# Patient Record
Sex: Female | Born: 2000 | Hispanic: Yes | Smoking: Never smoker
Health system: Southern US, Community
[De-identification: ages and names within clinical notes are randomized; demographics above are authoritative.]

## PROBLEM LIST (undated history)

## (undated) DIAGNOSIS — Z789 Other specified health status: Secondary | ICD-10-CM

## (undated) HISTORY — PX: NO PAST SURGERIES: SHX2092

---
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
## 2021-10-25 ENCOUNTER — Ambulatory Visit: Payer: Medicaid Other | Admitting: Licensed Practical Nurse

## 2021-10-26 ENCOUNTER — Ambulatory Visit: Payer: Medicaid Other | Admitting: Obstetrics

## 2021-11-10 ENCOUNTER — Ambulatory Visit: Payer: Medicaid Other | Admitting: Advanced Practice Midwife

## 2021-11-11 ENCOUNTER — Ambulatory Visit: Payer: Medicaid Other | Admitting: Advanced Practice Midwife

## 2021-11-24 ENCOUNTER — Telehealth: Payer: Self-pay | Admitting: Family Medicine

## 2021-11-24 NOTE — Telephone Encounter (Signed)
Patient called triage line to make appt. Tried calling patient back voicemail not set up.

## 2021-12-22 ENCOUNTER — Encounter: Payer: Self-pay | Admitting: Obstetrics & Gynecology

## 2021-12-22 ENCOUNTER — Other Ambulatory Visit (HOSPITAL_COMMUNITY)
Admission: RE | Admit: 2021-12-22 | Discharge: 2021-12-22 | Disposition: A | Discharge status: Home / Self Care | Payer: Self-pay | Source: Ambulatory Visit | Attending: Obstetrics & Gynecology | Admitting: Obstetrics & Gynecology

## 2021-12-22 ENCOUNTER — Ambulatory Visit (INDEPENDENT_AMBULATORY_CARE_PROVIDER_SITE_OTHER): Payer: Medicaid Other | Admitting: Obstetrics & Gynecology

## 2021-12-22 VITALS — BP 120/80 | Ht 63.0 in | Wt 182.0 lb

## 2021-12-22 DIAGNOSIS — Z3201 Encounter for pregnancy test, result positive: Secondary | ICD-10-CM | POA: Diagnosis not present

## 2021-12-22 DIAGNOSIS — N898 Other specified noninflammatory disorders of vagina: Secondary | ICD-10-CM | POA: Insufficient documentation

## 2021-12-22 DIAGNOSIS — N760 Acute vaginitis: Secondary | ICD-10-CM | POA: Diagnosis not present

## 2021-12-22 DIAGNOSIS — N926 Irregular menstruation, unspecified: Secondary | ICD-10-CM | POA: Diagnosis not present

## 2021-12-22 LAB — POCT URINE PREGNANCY: Preg Test, Ur: POSITIVE — AB

## 2021-12-26 LAB — CERVICOVAGINAL ANCILLARY ONLY
Bacterial Vaginitis (gardnerella): POSITIVE — AB
Candida Glabrata: NEGATIVE
Candida Vaginitis: NEGATIVE
Chlamydia: NEGATIVE
Comment: NEGATIVE
Comment: NEGATIVE
Comment: NEGATIVE
Comment: NEGATIVE
Comment: NEGATIVE
Comment: NORMAL
Neisseria Gonorrhea: NEGATIVE
Trichomonas: NEGATIVE

## 2022-01-02 ENCOUNTER — Telehealth: Payer: Self-pay

## 2022-01-02 ENCOUNTER — Other Ambulatory Visit: Payer: Self-pay | Admitting: Obstetrics

## 2022-01-02 MED ORDER — METRONIDAZOLE 500 MG PO TABS: 500.0000 mg | ORAL_TABLET | Freq: Two times a day (BID) | ORAL | 0 refills | Status: AC

## 2022-01-02 NOTE — Telephone Encounter (Signed)
Patient contacted office today wanting to know if a prescription would be sent in to treat her for BV? Patient states that she is pregnant and was seen in office on 12/22/21 and Nuswab came back positive for BV, patient states that she was not notified of results and states that she wanted to know if she could take medication to treat?she reports vaginal odor and discharge. Patient also reported on phone that she has her upcoming new OB intake appt and states that she has concerns because she has had lower abdominal pain intermittent associated with nausea and vomiting. Patient states that she vomits 4-5x a day. Patient would like prescriptions calling into [facility] on [address]. [place]

## 2022-01-02 NOTE — Telephone Encounter (Signed)
Hi Nicholos Johns. Looks like there is no note by [facility] for the visit on 8/10. I went ahead an ordered the patient some Metronidazole. You mention that she ishaving nausea and vomiting and is likely pregnant- it may be hard for her to take the pills. I will order her some zofran as well. Mirna Mires, CNM  01/02/2022 10:23 PM

## 2022-01-02 NOTE — Progress Notes (Unsigned)
This patein has contacted the office after noting that she saw her test swab from an office visit on 8/10 indicated BV. She was not notified by our office. I have sent in a script for flagyl. This patient has now reported a +pregnancy test and has an appointment. I will notify her that she may need to wait on the medication as she is early in the pregnancy, and is struggling with Nausea and vomiting. Mirna Mires, CNM  01/02/2022 10:17 PM

## 2022-01-04 ENCOUNTER — Ambulatory Visit (INDEPENDENT_AMBULATORY_CARE_PROVIDER_SITE_OTHER): Payer: Medicaid Other

## 2022-01-04 VITALS — Wt 182.0 lb

## 2022-01-04 DIAGNOSIS — Z369 Encounter for antenatal screening, unspecified: Secondary | ICD-10-CM

## 2022-01-04 DIAGNOSIS — Z3480 Encounter for supervision of other normal pregnancy, unspecified trimester: Secondary | ICD-10-CM

## 2022-01-04 DIAGNOSIS — Z3A00 Weeks of gestation of pregnancy not specified: Secondary | ICD-10-CM

## 2022-01-04 DIAGNOSIS — Z3482 Encounter for supervision of other normal pregnancy, second trimester: Secondary | ICD-10-CM

## 2022-01-04 HISTORY — DX: Encounter for supervision of other normal pregnancy, unspecified trimester: Z34.80

## 2022-01-04 NOTE — Progress Notes (Signed)
New OB Intake  I connected with  Samantha Gordon on 01/04/22 at  1:15 PM EDT by telephone and verified that I am speaking with the correct person using two identifiers. Nurse is located at [facility] and pt is located at home.  I explained I am completing New OB Intake today. We discussed her EDD of 07/08/2022 that is based on LMP of 10/01/2021. Pt is G2/P1001. I reviewed her allergies, medications, Medical/Surgical/OB history, and appropriate screenings. Based on history, this is a/an pregnancy uncomplicated .   Patient Active Problem List   Diagnosis Date Noted   Vaginitis 12/22/2021   Irregular contractions 04/06/2016   Labor and delivery indication for care or intervention 04/06/2016    Concerns addressed today Desires u/s to be sure of dates.  Delivery Plans:  Plans to deliver at [facility].  Anatomy US Explained first [place] will be scheduled for dating and an anatomy scan will be done at 20 weeks.  Labs Discussed genetic screening with patient. Patient desires genetic testing to be drawn with new OB labs. Discussed possible labs to be drawn at new OB appointment.  COVID Vaccine Patient has not had COVID vaccine.   Social Determinants of Health Food Insecurity: expresses food insecurity. Information given on local food banks.       Transportation: Patient denies transportation needs. Childcare: Discussed no children allowed at ultrasound appointments.   First visit review I reviewed new OB appt with pt. I explained she will have ob bloodwork and pap smear/pelvic exam if indicated. Explained pt will be seen by Dr. Linzie Collin at first visit; encounter routed to appropriate provider.   Loran Senters, [facility] 01/04/2022  1:35 PM  Clinical Staff Provider  Office Location  [facility] Dating    Language  English Anatomy US    Flu Vaccine  offer Genetic Screen  NIPS:   TDaP vaccine   offer Hgb A1C or  GTT Early : Third trimester :    Covid declines   LAB RESULTS   Rhogam   Blood Type     Feeding Plan undecided Antibody    Contraception undecided Rubella    Circumcision no RPR     Pediatrician  [facility] HBsAg     Support Person Felipe HIV    Prenatal Classes no Varicella     GBS  (For PCN allergy, check sensitivities)   BTL Consent  Hep C   VBAC Consent  Pap      Hgb Electro      CF      SMA

## 2022-01-04 NOTE — Progress Notes (Signed)
Pt is scheduled here at [facility] for the dating U/S.  There was availability.

## 2022-01-04 NOTE — Addendum Note (Signed)
Addended by: Loran Senters D on: 01/04/2022 02:55 PM   Modules accepted: Orders

## 2022-01-04 NOTE — Telephone Encounter (Signed)
Contacted patient and notified. KW

## 2022-01-05 ENCOUNTER — Other Ambulatory Visit: Payer: Medicaid Other

## 2022-01-05 ENCOUNTER — Ambulatory Visit: Payer: Medicaid Other

## 2022-01-05 DIAGNOSIS — Z3482 Encounter for supervision of other normal pregnancy, second trimester: Secondary | ICD-10-CM

## 2022-01-05 DIAGNOSIS — Z369 Encounter for antenatal screening, unspecified: Secondary | ICD-10-CM

## 2022-01-06 ENCOUNTER — Other Ambulatory Visit: Payer: Self-pay | Admitting: Obstetrics & Gynecology

## 2022-01-06 ENCOUNTER — Ambulatory Visit
Admission: RE | Admit: 2022-01-06 | Discharge: 2022-01-06 | Disposition: A | Discharge status: Home / Self Care | Payer: Medicaid Other | Source: Ambulatory Visit | Attending: Obstetrics & Gynecology | Admitting: Obstetrics & Gynecology

## 2022-01-06 DIAGNOSIS — Z3A13 13 weeks gestation of pregnancy: Secondary | ICD-10-CM | POA: Diagnosis not present

## 2022-01-06 DIAGNOSIS — Z3687 Encounter for antenatal screening for uncertain dates: Secondary | ICD-10-CM | POA: Insufficient documentation

## 2022-01-06 DIAGNOSIS — Z3482 Encounter for supervision of other normal pregnancy, second trimester: Secondary | ICD-10-CM

## 2022-01-09 ENCOUNTER — Ambulatory Visit (INDEPENDENT_AMBULATORY_CARE_PROVIDER_SITE_OTHER): Payer: Medicaid Other | Admitting: Obstetrics & Gynecology

## 2022-01-09 ENCOUNTER — Encounter: Payer: Self-pay | Admitting: Obstetrics & Gynecology

## 2022-01-09 ENCOUNTER — Other Ambulatory Visit: Payer: Medicaid Other

## 2022-01-09 VITALS — BP 104/62 | Wt 177.6 lb

## 2022-01-09 DIAGNOSIS — Z3482 Encounter for supervision of other normal pregnancy, second trimester: Secondary | ICD-10-CM

## 2022-01-09 DIAGNOSIS — O43101 Malformation of placenta, unspecified, first trimester: Secondary | ICD-10-CM

## 2022-01-09 DIAGNOSIS — Z3A14 14 weeks gestation of pregnancy: Secondary | ICD-10-CM

## 2022-01-09 DIAGNOSIS — Z369 Encounter for antenatal screening, unspecified: Secondary | ICD-10-CM

## 2022-01-09 LAB — POCT URINALYSIS DIPSTICK OB
Bilirubin, UA: NEGATIVE
Blood, UA: NEGATIVE
Glucose, UA: NEGATIVE
Ketones, UA: NEGATIVE
Leukocytes, UA: NEGATIVE
Nitrite, UA: NEGATIVE
Spec Grav, UA: 1.025 (ref 1.010–1.025)
Urobilinogen, UA: 0.2 E.U./dL
pH, UA: 6 (ref 5.0–8.0)

## 2022-01-09 NOTE — Progress Notes (Signed)
  Subjective:    Samantha Gordon is a 21 y.o. female being seen today for her obstetrical visit. She is at [redacted]w[redacted]d gestation. Patient reports no complaints. Fetal movement: normal.  Menstrual History: OB History     Gravida  2   Para  1   Term  1   Preterm      AB      Living  1      SAB      IAB      Ectopic      Multiple  0   Live Births  1            Patient's last menstrual period was 10/01/2021 (exact date).    The following portions of the patient's history were reviewed and updated as appropriate: allergies, current medications, past family history, past medical history, past social history, past surgical history, and problem list.  Review of Systems A comprehensive review of systems was negative.   Objective:     BP 104/62   Wt 177 lb 9.6 oz (80.6 kg)   LMP 10/01/2021 (Exact Date)   BMI 31.46 kg/m  Under 12 weeks no FHT Uterine Size:   Pelvic Exam:          Perineum:                 Vulva:               Vagina:                       pH:               Cervix:           Wet Prep:               Uterus:              Adnexa:      Bony Pelvis:      Assessment:    Pregnancy  and 14 2/7 weeks   Plan:    Problem list reviewed and updated. Labs reviewed. AFP3 discussed:  discussed . Role of ultrasound in pregnancy discussed; fetal survey: ordered. Amniocentesis discussed: not indicated. Follow up in 4 weeks. Questioning placental lake, repeat US ordered for 2 weeks from now  Linzie Collin, MD  02/26/2022 9:20 PM \

## 2022-01-10 LAB — CBC/D/PLT+RPR+RH+ABO+RUBIGG...
Antibody Screen: NEGATIVE
Basophils Absolute: 0 10*3/uL (ref 0.0–0.2)
Basos: 0 %
EOS (ABSOLUTE): 0.1 10*3/uL (ref 0.0–0.4)
Eos: 1 %
HCV Ab: NONREACTIVE
HIV Screen 4th Generation wRfx: NONREACTIVE
Hematocrit: 39.2 % (ref 34.0–46.6)
Hemoglobin: 13.5 g/dL (ref 11.1–15.9)
Hepatitis B Surface Ag: NEGATIVE
Immature Grans (Abs): 0 10*3/uL (ref 0.0–0.1)
Immature Granulocytes: 0 %
Lymphocytes Absolute: 1.4 10*3/uL (ref 0.7–3.1)
Lymphs: 17 %
MCH: 31.2 pg (ref 26.6–33.0)
MCHC: 34.4 g/dL (ref 31.5–35.7)
MCV: 91 fL (ref 79–97)
Monocytes Absolute: 0.7 10*3/uL (ref 0.1–0.9)
Monocytes: 8 %
Neutrophils Absolute: 6.2 10*3/uL (ref 1.4–7.0)
Neutrophils: 74 %
Platelets: 317 10*3/uL (ref 150–450)
RBC: 4.33 x10E6/uL (ref 3.77–5.28)
RDW: 12.8 % (ref 11.7–15.4)
RPR Ser Ql: NONREACTIVE
Rh Factor: POSITIVE
Rubella Antibodies, IGG: 1.77 index (ref >0.99)
Varicella zoster IgG: 368 index (ref >165)
WBC: 8.4 10*3/uL (ref 3.4–10.8)

## 2022-01-10 LAB — HCV INTERPRETATION

## 2022-01-13 LAB — MATERNIT 21 PLUS CORE, BLOOD
Fetal Fraction: 5
Result (T21): NEGATIVE
Trisomy 13 (Patau syndrome): NEGATIVE
Trisomy 18 (Edwards syndrome): NEGATIVE
Trisomy 21 (Down syndrome): NEGATIVE

## 2022-01-23 ENCOUNTER — Ambulatory Visit: Admission: RE | Admit: 2022-01-23 | Payer: Medicaid Other | Source: Ambulatory Visit

## 2022-01-25 ENCOUNTER — Ambulatory Visit
Admission: RE | Admit: 2022-01-25 | Discharge: 2022-01-25 | Disposition: A | Discharge status: Home / Self Care | Payer: Medicaid Other | Source: Ambulatory Visit | Attending: Obstetrics & Gynecology | Admitting: Obstetrics & Gynecology

## 2022-01-25 DIAGNOSIS — O43101 Malformation of placenta, unspecified, first trimester: Secondary | ICD-10-CM | POA: Diagnosis present

## 2022-02-06 ENCOUNTER — Encounter: Payer: Medicaid Other | Admitting: Obstetrics & Gynecology

## 2022-02-06 ENCOUNTER — Telehealth: Payer: Self-pay | Admitting: Obstetrics & Gynecology

## 2022-02-06 NOTE — Telephone Encounter (Signed)
Reached out to pt to reschedule OB appt that was scheduled on 9/25 at 10:55 with Dr. Hulan Fray.  Could not leave a message, because mailbox was not set up.  Will send a MyChart message.

## 2022-02-11 ENCOUNTER — Encounter: Payer: Self-pay | Admitting: Obstetrics & Gynecology

## 2022-02-11 NOTE — Progress Notes (Signed)
Subjective:     Samantha Gordon is a 21 y.o. female who presents for evaluation of an abnormal vaginal discharge. Symptoms have been present for  Vaginal symptoms: discharge described as malodorous, odor, and missed period, acute vaginitis. . Contraception: none.  Menstrual flow: missed.  The following portions of the patient's history were reviewed and updated as appropriate: allergies, current medications, past family history, past medical history, past social history, past surgical history, and problem list.   Review of Systems A comprehensive review of systems was negative.    Objective:    BP 120/80   Ht 5\' 3"  (1.6 m)   Wt 182 lb (82.6 kg)   LMP 10/01/2021   BMI 32.24 kg/m   General Appearance:    Alert, cooperative, no distress, appears stated age  Head:    Normocephalic, without obvious abnormality, atraumatic  Eyes:    PERRL, conjunctiva/corneas clear, EOM's intact, fundi    benign, both eyes  Ears:    Normal TM's and external ear canals, both ears  Nose:   Nares normal, septum midline, mucosa normal, no drainage    or sinus tenderness  Throat:   Lips, mucosa, and tongue normal; teeth and gums normal  Neck:   Supple, symmetrical, trachea midline, no adenopathy;    thyroid:  no enlargement/tenderness/nodules; no carotid   bruit or JVD  Back:     Symmetric, no curvature, ROM normal, no CVA tenderness  Lungs:     Clear to auscultation bilaterally, respirations unlabored  Chest Wall:    No tenderness or deformity   Heart:    Regular rate and rhythm, S1 and S2 normal, no murmur, rub   or gallop  Breast Exam:    No tenderness, masses, or nipple abnormality  Abdomen:     Soft, non-tender, bowel sounds active all four quadrants,    no masses, no organomegaly  Genitalia:    Normal female without lesion, discharge or tenderness  Rectal:    Normal tone, normal prostate, no masses or tenderness;   guaiac negative stool  Extremities:   Extremities normal, atraumatic, no  cyanosis or edema  Pulses:   2+ and symmetric all extremities  Skin:   Skin color, texture, turgor normal, no rashes or lesions  Lymph nodes:   Cervical, supraclavicular, and axillary nodes normal  Neurologic:   CNII-XII intact, normal strength, sensation and reflexes    throughout      Assessment:    Bacterial vaginosis.    Plan:    Cervicovaginal ancillary, POCT urine preg. ordered  RTN as scheduled  Rosario Adie, MD  02/11/2022 2:02 PM

## 2022-02-20 ENCOUNTER — Telehealth: Payer: Self-pay

## 2022-02-20 NOTE — Telephone Encounter (Signed)
Patient called in stating that she has some urinary symptoms. She has an appointment tomorrow and wanted to make sure that she could be evaluated for new concerns at that appointment. I advised her that she should let the CMA know what her new symptoms are at intake and concerns would be addressed at the appointment tomorrow. She verbalized understanding.

## 2022-02-21 ENCOUNTER — Ambulatory Visit (INDEPENDENT_AMBULATORY_CARE_PROVIDER_SITE_OTHER): Payer: Medicaid Other | Admitting: Obstetrics

## 2022-02-21 ENCOUNTER — Other Ambulatory Visit (HOSPITAL_COMMUNITY)
Admission: RE | Admit: 2022-02-21 | Discharge: 2022-02-21 | Disposition: A | Discharge status: Home / Self Care | Payer: Medicaid Other | Source: Ambulatory Visit | Attending: Obstetrics & Gynecology | Admitting: Obstetrics & Gynecology

## 2022-02-21 VITALS — BP 124/73 | HR 97 | Wt 170.9 lb

## 2022-02-21 DIAGNOSIS — R309 Painful micturition, unspecified: Secondary | ICD-10-CM | POA: Insufficient documentation

## 2022-02-21 DIAGNOSIS — Z3A20 20 weeks gestation of pregnancy: Secondary | ICD-10-CM

## 2022-02-21 DIAGNOSIS — O99891 Other specified diseases and conditions complicating pregnancy: Secondary | ICD-10-CM

## 2022-02-21 DIAGNOSIS — Z3482 Encounter for supervision of other normal pregnancy, second trimester: Secondary | ICD-10-CM

## 2022-02-21 LAB — POCT URINALYSIS DIPSTICK OB
Bilirubin, UA: NEGATIVE
Blood, UA: NEGATIVE
Glucose, UA: NEGATIVE
Ketones, UA: NEGATIVE
Nitrite, UA: NEGATIVE
POC,PROTEIN,UA: NEGATIVE
Spec Grav, UA: 1.02 (ref 1.010–1.025)
Urobilinogen, UA: 0.2 E.U./dL
pH, UA: 7 (ref 5.0–8.0)

## 2022-02-21 NOTE — Progress Notes (Signed)
ROB at [redacted]w[redacted]d. Good fetal movement. Samantha Gordon denies ctx, vaginal bleeding, LOF. She has been having pain with urination and vaginal discharge for about 4-5 days. She denies hematuria but does endorse urinary frequency. She reports clumpy white discharge. Urine culture sent. Swab collected. [name] has had [place] to evaluate placental lake but not an anatomy scan. Will schedule in next 1-2 weeks. Anticipatory guidance about late 2nd trimester. ROB in 4 weeks.  Lloyd Huger, CNM

## 2022-02-23 LAB — CERVICOVAGINAL ANCILLARY ONLY
Bacterial Vaginitis (gardnerella): POSITIVE — AB
Candida Glabrata: NEGATIVE
Candida Vaginitis: POSITIVE — AB
Chlamydia: NEGATIVE
Comment: NEGATIVE
Comment: NEGATIVE
Comment: NEGATIVE
Comment: NEGATIVE
Comment: NEGATIVE
Comment: NORMAL
Neisseria Gonorrhea: NEGATIVE
Trichomonas: NEGATIVE

## 2022-02-23 LAB — URINE CULTURE

## 2022-02-24 ENCOUNTER — Telehealth: Payer: Self-pay

## 2022-02-24 DIAGNOSIS — B9689 Other specified bacterial agents as the cause of diseases classified elsewhere: Secondary | ICD-10-CM

## 2022-02-24 MED ORDER — METRONIDAZOLE 500 MG PO TABS: 500.0000 mg | ORAL_TABLET | Freq: Two times a day (BID) | ORAL | 0 refills | Status: DC

## 2022-02-24 NOTE — Telephone Encounter (Signed)
Pt called for Aptima results positive for BV and yeast. Flagyl sent in for BV advised pt to do Monistat for Yeast.

## 2022-02-26 ENCOUNTER — Encounter: Payer: Self-pay | Admitting: Obstetrics & Gynecology

## 2022-02-28 ENCOUNTER — Ambulatory Visit
Admission: RE | Admit: 2022-02-28 | Discharge: 2022-02-28 | Disposition: A | Discharge status: Home / Self Care | Payer: Medicaid Other | Source: Ambulatory Visit | Attending: Obstetrics | Admitting: Obstetrics

## 2022-02-28 DIAGNOSIS — Z3689 Encounter for other specified antenatal screening: Secondary | ICD-10-CM | POA: Diagnosis not present

## 2022-02-28 DIAGNOSIS — Z3A22 22 weeks gestation of pregnancy: Secondary | ICD-10-CM | POA: Diagnosis not present

## 2022-02-28 DIAGNOSIS — Z3482 Encounter for supervision of other normal pregnancy, second trimester: Secondary | ICD-10-CM | POA: Insufficient documentation

## 2022-03-10 ENCOUNTER — Encounter: Payer: Self-pay | Admitting: Obstetrics

## 2022-03-21 ENCOUNTER — Ambulatory Visit (INDEPENDENT_AMBULATORY_CARE_PROVIDER_SITE_OTHER): Payer: Medicaid Other | Admitting: Licensed Practical Nurse

## 2022-03-21 ENCOUNTER — Encounter: Payer: Self-pay | Admitting: Licensed Practical Nurse

## 2022-03-21 VITALS — BP 115/73 | HR 97 | Wt 171.0 lb

## 2022-03-21 DIAGNOSIS — Z131 Encounter for screening for diabetes mellitus: Secondary | ICD-10-CM

## 2022-03-21 DIAGNOSIS — Z3A24 24 weeks gestation of pregnancy: Secondary | ICD-10-CM

## 2022-03-21 DIAGNOSIS — B9689 Other specified bacterial agents as the cause of diseases classified elsewhere: Secondary | ICD-10-CM

## 2022-03-21 DIAGNOSIS — Z23 Encounter for immunization: Secondary | ICD-10-CM | POA: Diagnosis not present

## 2022-03-21 DIAGNOSIS — Z3482 Encounter for supervision of other normal pregnancy, second trimester: Secondary | ICD-10-CM

## 2022-03-21 LAB — POCT URINALYSIS DIPSTICK
Bilirubin, UA: NEGATIVE
Blood, UA: NEGATIVE
Glucose, UA: NEGATIVE
Ketones, UA: NEGATIVE
Leukocytes, UA: NEGATIVE
Nitrite, UA: NEGATIVE
Protein, UA: NEGATIVE
Spec Grav, UA: 1.015 (ref 1.010–1.025)
Urobilinogen, UA: 1 E.U./dL
pH, UA: 5.5 (ref 5.0–8.0)

## 2022-03-21 NOTE — Progress Notes (Signed)
Routine Prenatal Care Visit  Subjective  Samantha Gordon is a 21 y.o. G2P1001 at [redacted]w[redacted]d being seen today for ongoing prenatal care.  She is currently monitored for the following issues for this low-risk pregnancy and has Irregular contractions; Labor and delivery indication for care or intervention; Vaginitis; and Supervision of other normal pregnancy, antepartum on their problem list.  ----------------------------------------------------------------------------------- Patient reports  fatigue, gets 10 hours of sleep and naps .  Her urine is cloudy, wonders if she has a UTI, denies dysuria-reviewed most recent urine culture was negative. Urine dark today, encouraged pt to increase fluids. -Expressed concern for being treated for BV twice in this pregnancy, Currently denies any vaginal symptoms. Reviewed that if she develops symptoms again-we would do more extensive testing and may need to change treatment plan.  -considering breastfeeding this time, Contractions: Not present. Vag. Bleeding: None.  Movement: Present. Leaking Fluid denies.  ----------------------------------------------------------------------------------- The following portions of the patient's history were reviewed and updated as appropriate: allergies, current medications, past family history, past medical history, past social history, past surgical history and problem list. Problem list updated.  Objective  Blood pressure 115/73, pulse 97, weight 171 lb (77.6 kg), last menstrual period 10/01/2021. Pregravid weight 185 lb (83.9 kg) Total Weight Gain -14 lb (-6.35 kg) Urinalysis: Urine Protein    Urine Glucose    Fetal Status: Fetal Heart Rate (bpm): 130 Fundal Height: 25 cm Movement: Present     General:  Alert, oriented and cooperative. Patient is in no acute distress.  Skin: Skin is warm and dry. No rash noted.   Cardiovascular: Normal heart rate noted  Respiratory: Normal respiratory effort, no problems with  respiration noted  Abdomen: Soft, gravid, appropriate for gestational age. Pain/Pressure: Absent     Pelvic:  Cervical exam deferred        Extremities: Normal range of motion.  Edema: None  Mental Status: Normal mood and affect. Normal behavior. Normal judgment and thought content.   Assessment   21 y.o. G2P1001 at [redacted]w[redacted]d by  07/08/2022, by Last Menstrual Period presenting for routine prenatal visit  Plan   second Problems (from 01/04/22 to present)     No problems associated with this episode.        Preterm labor symptoms and general obstetric precautions including but not limited to vaginal bleeding, contractions, leaking of fluid and fetal movement were reviewed in detail with the patient. Please refer to After Visit Summary for other counseling recommendations.   Return in about 4 weeks (around 04/18/2022) for ROB, 28 wk labs . Flu vaccine today   Roberto Scales, [place], [facility]  03/21/22  11:22 AM

## 2022-04-19 ENCOUNTER — Telehealth: Payer: Self-pay

## 2022-04-19 ENCOUNTER — Encounter: Payer: Self-pay | Admitting: Licensed Practical Nurse

## 2022-04-19 ENCOUNTER — Encounter: Payer: Medicaid Other | Admitting: Advanced Practice Midwife

## 2022-04-19 ENCOUNTER — Ambulatory Visit (INDEPENDENT_AMBULATORY_CARE_PROVIDER_SITE_OTHER): Payer: Medicaid Other | Admitting: Licensed Practical Nurse

## 2022-04-19 ENCOUNTER — Other Ambulatory Visit: Payer: Medicaid Other

## 2022-04-19 VITALS — BP 113/69 | HR 95 | Wt 172.8 lb

## 2022-04-19 DIAGNOSIS — Z3A29 29 weeks gestation of pregnancy: Secondary | ICD-10-CM

## 2022-04-19 DIAGNOSIS — Z3483 Encounter for supervision of other normal pregnancy, third trimester: Secondary | ICD-10-CM

## 2022-04-19 DIAGNOSIS — O2613 Low weight gain in pregnancy, third trimester: Secondary | ICD-10-CM

## 2022-04-19 DIAGNOSIS — Z3A28 28 weeks gestation of pregnancy: Secondary | ICD-10-CM

## 2022-04-19 DIAGNOSIS — Z3482 Encounter for supervision of other normal pregnancy, second trimester: Secondary | ICD-10-CM

## 2022-04-19 DIAGNOSIS — O26893 Other specified pregnancy related conditions, third trimester: Secondary | ICD-10-CM

## 2022-04-19 DIAGNOSIS — B9689 Other specified bacterial agents as the cause of diseases classified elsewhere: Secondary | ICD-10-CM

## 2022-04-19 DIAGNOSIS — O219 Vomiting of pregnancy, unspecified: Secondary | ICD-10-CM

## 2022-04-19 DIAGNOSIS — O26813 Pregnancy related exhaustion and fatigue, third trimester: Secondary | ICD-10-CM

## 2022-04-19 LAB — POCT URINALYSIS DIPSTICK
Bilirubin, UA: NEGATIVE
Blood, UA: NEGATIVE
Glucose, UA: NEGATIVE
Ketones, UA: NEGATIVE
Leukocytes, UA: NEGATIVE
Nitrite, UA: NEGATIVE
Protein, UA: NEGATIVE
Spec Grav, UA: 1.01 (ref 1.010–1.025)
Urobilinogen, UA: 1 E.U./dL
pH, UA: 6.5 (ref 5.0–8.0)

## 2022-04-19 MED ORDER — DOXYLAMINE-PYRIDOXINE 10-10 MG PO TBEC: 2.0000 | DELAYED_RELEASE_TABLET | Freq: Every day | ORAL | 5 refills | Status: DC

## 2022-04-19 NOTE — Telephone Encounter (Signed)
Pt calling; was seen today and told she was measuring small; is unable to schedule u/s appt d/t they, [facility] u/s, needs order faxed over and they haven't recv'd it yet. Please call pt when this is done.  [phone]

## 2022-04-20 LAB — 28 WEEK RH+PANEL
Basophils Absolute: 0 10*3/uL (ref 0.0–0.2)
Basos: 0 %
EOS (ABSOLUTE): 0.1 10*3/uL (ref 0.0–0.4)
Eos: 1 %
Gestational Diabetes Screen: 150 mg/dL — ABNORMAL HIGH (ref 70–139)
HIV Screen 4th Generation wRfx: NONREACTIVE
Hematocrit: 32.9 % — ABNORMAL LOW (ref 34.0–46.6)
Hemoglobin: 11.3 g/dL (ref 11.1–15.9)
Immature Grans (Abs): 0.1 10*3/uL (ref 0.0–0.1)
Immature Granulocytes: 1 %
Lymphocytes Absolute: 1.3 10*3/uL (ref 0.7–3.1)
Lymphs: 13 %
MCH: 31.5 pg (ref 26.6–33.0)
MCHC: 34.3 g/dL (ref 31.5–35.7)
MCV: 92 fL (ref 79–97)
Monocytes Absolute: 0.7 10*3/uL (ref 0.1–0.9)
Monocytes: 7 %
Neutrophils Absolute: 7.6 10*3/uL — ABNORMAL HIGH (ref 1.4–7.0)
Neutrophils: 78 %
Platelets: 262 10*3/uL (ref 150–450)
RBC: 3.59 x10E6/uL — ABNORMAL LOW (ref 3.77–5.28)
RDW: 11.5 % — ABNORMAL LOW (ref 11.7–15.4)
RPR Ser Ql: NONREACTIVE
WBC: 9.7 10*3/uL (ref 3.4–10.8)

## 2022-04-20 LAB — CBC
Hematocrit: 33 % — ABNORMAL LOW (ref 34.0–46.6)
Hemoglobin: 11 g/dL — ABNORMAL LOW (ref 11.1–15.9)
MCH: 31.1 pg (ref 26.6–33.0)
MCHC: 33.3 g/dL (ref 31.5–35.7)
MCV: 93 fL (ref 79–97)
Platelets: 242 10*3/uL (ref 150–450)
RBC: 3.54 x10E6/uL — ABNORMAL LOW (ref 3.77–5.28)
RDW: 12.1 % (ref 11.7–15.4)
WBC: 10 10*3/uL (ref 3.4–10.8)

## 2022-04-20 NOTE — Telephone Encounter (Signed)
Pt calling to get results of her glucose test. Pt aware it was elevated and she would probably get schedule for a 3 hour test, didn't know if you would order or was we supposed to .

## 2022-04-20 NOTE — Telephone Encounter (Signed)
I contacted patient via phone. I left voicemail for patient to call back to be scheduled.   

## 2022-04-20 NOTE — Telephone Encounter (Signed)
Sent to sara to schedule.

## 2022-04-22 LAB — NUSWAB VAGINITIS PLUS (VG+)
Atopobium vaginae: HIGH Score — AB
Candida albicans, NAA: NEGATIVE
Candida glabrata, NAA: NEGATIVE
Chlamydia trachomatis, NAA: NEGATIVE
Megasphaera 1: HIGH Score — AB
Neisseria gonorrhoeae, NAA: NEGATIVE
Trich vag by NAA: NEGATIVE

## 2022-04-23 MED ORDER — CLINDAMYCIN HCL 300 MG PO CAPS: 300.0000 mg | ORAL_CAPSULE | Freq: Two times a day (BID) | ORAL | 0 refills | Status: DC

## 2022-04-23 MED ORDER — ONDANSETRON HCL 4 MG PO TABS: 4.0000 mg | ORAL_TABLET | Freq: Three times a day (TID) | ORAL | 0 refills | Status: DC | PRN

## 2022-04-23 NOTE — Progress Notes (Signed)
04/23/2022 TC to [facility], You have BV.  I have ordered Clindamycin as this may help better.  Reviewed Boric Acid and vaginal probiotics with the caution that it is not clear if these are safe in pregnancy, but something to be considered. Reviewed sometimes BV is chronic and needs a long course of treatment to be cleared.   Pt reports her insurance does not to cover Diclegis desires  script for Zofran-script sent   Samantha Gordon   [facility]  04/23/22  5:33 PM

## 2022-04-23 NOTE — Progress Notes (Signed)
Routine Prenatal Care Visit  Subjective  Samantha Gordon is a 21 y.o. G2P1001 at [redacted]w[redacted]d being seen today for ongoing prenatal care.  She is currently monitored for the following issues for this low-risk pregnancy and has Irregular contractions; Labor and delivery indication for care or intervention; Vaginitis; and Supervision of other normal pregnancy, antepartum on their problem list.  ----------------------------------------------------------------------------------- Patient reports vaginal discharge with a fishy odor it is white/yellow it started 3 days ago, denies irritation. Was treated with Metro gel Oct 13. Will collect sample today.  Reviewed potential chronic BV  -Has N/V, at times cannot keep anything down after eating  -TWG -12, not eating much  Contractions: Not present. Vag. Bleeding: None.  Movement: Present. Leaking Fluid denies.  ----------------------------------------------------------------------------------- The following portions of the patient's history were reviewed and updated as appropriate: allergies, current medications, past family history, past medical history, past social history, past surgical history and problem list. Problem list updated.  Objective  Blood pressure 113/69, pulse 95, weight 172 lb 12.8 oz (78.4 kg), last menstrual period 10/01/2021. Pregravid weight 185 lb (83.9 kg) Total Weight Gain -12 lb 3.2 oz (-5.534 kg) Urinalysis: Urine Protein    Urine Glucose    Fetal Status: Fetal Heart Rate (bpm): 135 Fundal Height: 29 cm Movement: Present     General:  Alert, oriented and cooperative. Patient is in no acute distress.  Skin: Skin is warm and dry. No rash noted.   Cardiovascular: Normal heart rate noted  Respiratory: Normal respiratory effort, no problems with respiration noted  Abdomen: Soft, gravid, appropriate for gestational age. Pain/Pressure: Absent     Pelvic:  Cervical exam deferred        Extremities: Normal range of motion.      Mental Status: Normal mood and affect. Normal behavior. Normal judgment and thought content.   Assessment   21 y.o. G2P1001 at [redacted]w[redacted]d by  07/08/2022, by Last Menstrual Period presenting for routine prenatal visit  Plan   second Problems (from 01/04/22 to present)     No problems associated with this episode.        Preterm labor symptoms and general obstetric precautions including but not limited to vaginal bleeding, contractions, leaking of fluid and fetal movement were reviewed in detail with the patient. Please refer to After Visit Summary for other counseling recommendations.   Return in about 2 weeks (around 05/03/2022) for ROB. Diclegis ordered  US ordered  low weight gain and FH lagging.   Carie Caddy, CNM   [facility]  04/23/22  5:40 PM

## 2022-04-26 ENCOUNTER — Ambulatory Visit
Admission: RE | Admit: 2022-04-26 | Discharge: 2022-04-26 | Disposition: A | Discharge status: Home / Self Care | Payer: Medicaid Other | Source: Ambulatory Visit | Attending: Licensed Practical Nurse | Admitting: Licensed Practical Nurse

## 2022-04-26 DIAGNOSIS — O26843 Uterine size-date discrepancy, third trimester: Secondary | ICD-10-CM | POA: Insufficient documentation

## 2022-04-26 DIAGNOSIS — Z369 Encounter for antenatal screening, unspecified: Secondary | ICD-10-CM | POA: Diagnosis not present

## 2022-04-26 DIAGNOSIS — Z3A29 29 weeks gestation of pregnancy: Secondary | ICD-10-CM | POA: Insufficient documentation

## 2022-04-26 DIAGNOSIS — Z3482 Encounter for supervision of other normal pregnancy, second trimester: Secondary | ICD-10-CM | POA: Insufficient documentation

## 2022-04-27 ENCOUNTER — Other Ambulatory Visit: Payer: Self-pay | Admitting: Licensed Practical Nurse

## 2022-04-27 ENCOUNTER — Other Ambulatory Visit: Payer: Medicaid Other

## 2022-04-27 DIAGNOSIS — R7309 Other abnormal glucose: Secondary | ICD-10-CM

## 2022-04-28 LAB — GESTATIONAL GLUCOSE TOLERANCE
Glucose, Fasting: 74 mg/dL (ref 70–94)
Glucose, GTT - 1 Hour: 155 mg/dL (ref 70–179)
Glucose, GTT - 2 Hour: 121 mg/dL (ref 70–154)
Glucose, GTT - 3 Hour: 91 mg/dL (ref 70–139)

## 2022-05-03 ENCOUNTER — Ambulatory Visit (INDEPENDENT_AMBULATORY_CARE_PROVIDER_SITE_OTHER): Payer: Medicaid Other | Admitting: Obstetrics and Gynecology

## 2022-05-03 VITALS — BP 111/71 | HR 81 | Wt 174.0 lb

## 2022-05-03 DIAGNOSIS — Z3A30 30 weeks gestation of pregnancy: Secondary | ICD-10-CM

## 2022-05-03 DIAGNOSIS — Z3483 Encounter for supervision of other normal pregnancy, third trimester: Secondary | ICD-10-CM

## 2022-05-03 LAB — POCT URINALYSIS DIPSTICK
Glucose, UA: NEGATIVE
Ketones, UA: NEGATIVE
Leukocytes, UA: NEGATIVE
Nitrite, UA: NEGATIVE
Protein, UA: NEGATIVE

## 2022-05-03 NOTE — Progress Notes (Addendum)
ROB. Patient states no concerns at this time. She had her 3 hr gtt done. No VB. No LOF. Pt had growth u/s 12/13. 71st percentile.

## 2022-05-03 NOTE — Progress Notes (Signed)
ROB: Denies problems.  Passed her 3-hour GTT.  Reports daily movement.

## 2022-05-15 NOTE — L&D Delivery Note (Cosign Needed Addendum)
Obstetrical Delivery Note   Date of Delivery:   07/04/2022 Primary OB:  [facility]  Gestational Age/EDD: [redacted]w[redacted]d(Dated by LMP) Reason for Admission: SROM Antepartum complications: none  Delivered By:   LLawanda Cousins CNM   Delivery Type:   spontaneous vaginal delivery  Delivery Details:   PT SROM'd for clear fluid around 0630, contractions started around 0730. Pt arrived in early labor. She received Fentanyl IV x 2 and then a labor epidural. Around 1830 pt reported having a headache and feeling cold, her HR was noted to be in the 130's, temp was 99. She was given 1,0080mTylenol. Her VE at that time was an anterior lip, the lip was reduced over 2 contractions. She was found to be complete at 1835. She beautifully pushed the fetus down to a gentle slow crown. SVB of viable female over an intact perineum at [place] bir[place] to ROA, shoulders tight but easily born with gentle downward traction. Infant placed on mother's abdomen, HR> 100, a little vigorous cry with stimulation, pink by 5 mins. Cord was doubly clamped and cut by dad once pulsation ceased. Placenta delivered with minimal maternal effort. Brisk red bleeding noted with placenta. Pitocin infusion started. Fundus initially boggy but firmed up with massage and bleeding stabilized. Infant skin to skin with mother. Both stable.  Pt developed fever while in recovery.  Clindamycin and Gentamycin given.   Anesthesia:    epidural Intrapartum complications: None GBS:    Negative Laceration:    none Episiotomy:    none Rectal exam:   deferred Placenta:    Delivered and expressed via active management. Intact: yes. To pathology: no.  Delayed Cord Clamping: yes Estimated Blood Loss:  50055mBaby:    Liveborn female, APGARs 8/9, weight pending gm  Samantha Gordon Scal[place]NM[place]  07/04/2022 7:23 PM

## 2022-05-24 ENCOUNTER — Ambulatory Visit (INDEPENDENT_AMBULATORY_CARE_PROVIDER_SITE_OTHER): Payer: Medicaid Other | Admitting: Obstetrics

## 2022-05-24 VITALS — BP 105/71 | HR 87 | Wt 172.0 lb

## 2022-05-24 DIAGNOSIS — Z3483 Encounter for supervision of other normal pregnancy, third trimester: Secondary | ICD-10-CM

## 2022-05-24 DIAGNOSIS — Z3482 Encounter for supervision of other normal pregnancy, second trimester: Secondary | ICD-10-CM

## 2022-05-24 DIAGNOSIS — Z113 Encounter for screening for infections with a predominantly sexual mode of transmission: Secondary | ICD-10-CM

## 2022-05-24 DIAGNOSIS — Z3A33 33 weeks gestation of pregnancy: Secondary | ICD-10-CM

## 2022-05-24 NOTE — Progress Notes (Signed)
ROB. Patient states no issues since the last time she was here. Patient states no questions or concerns at this time.  No vb. No lof.

## 2022-05-24 NOTE — Progress Notes (Signed)
Routine Prenatal Care Visit  Subjective  Samantha Gordon is a 22 y.o. G2P1001 at [redacted]w[redacted]d being seen today for ongoing prenatal care.  She is currently monitored for the following issues for this low-risk pregnancy and has Irregular contractions; Labor and delivery indication for care or intervention; Vaginitis; and Supervision of other normal pregnancy, antepartum on their problem list. She is present with her husband and duaghter. Hre first labor was routine with a short second stage. ----------------------------------------------------------------------------------- Patient reports no complaints.    .  .   Jacklyn Shell Fluid denies.  ----------------------------------------------------------------------------------- The following portions of the patient's history were reviewed and updated as appropriate: allergies, current medications, past family history, past medical history, past social history, past surgical history and problem list. Problem list updated.  Objective  Blood pressure 105/71, pulse 87, weight 172 lb (78 kg), last menstrual period 10/01/2021. Pregravid weight 185 lb (83.9 kg) Total Weight Gain -13 lb (-5.897 kg) Urinalysis: Urine Protein    Urine Glucose    Fetal Status:           General:  Alert, oriented and cooperative. Patient is in no acute distress.  Skin: Skin is warm and dry. No rash noted.   Cardiovascular: Normal heart rate noted  Respiratory: Normal respiratory effort, no problems with respiration noted  Abdomen: Soft, gravid, appropriate for gestational age.       Pelvic:  Cervical exam deferred        Extremities: Normal range of motion.     Mental Status: Normal mood and affect. Normal behavior. Normal judgment and thought content.   Assessment   22 y.o. G2P1001 at [redacted]w[redacted]d by  07/08/2022, by Last Menstrual Period presenting for routine prenatal visit  Plan   second Problems (from 01/04/22 to present)     No problems associated with this episode.         Preterm labor symptoms and general obstetric precautions including but not limited to vaginal bleeding, contractions, leaking of fluid and fetal movement were reviewed in detail with the patient. Please refer to After Visit Summary for other counseling recommendations.   Return in about 2 weeks (around 06/07/2022) for return OB. We will do cultures at that visit.  Imagene Riches, CNM  05/24/2022 4:22 PM

## 2022-06-07 ENCOUNTER — Ambulatory Visit (INDEPENDENT_AMBULATORY_CARE_PROVIDER_SITE_OTHER): Payer: Medicaid Other | Admitting: Obstetrics and Gynecology

## 2022-06-07 ENCOUNTER — Other Ambulatory Visit (HOSPITAL_COMMUNITY)
Admission: RE | Admit: 2022-06-07 | Discharge: 2022-06-07 | Disposition: A | Discharge status: Home / Self Care | Payer: Medicaid Other | Source: Ambulatory Visit | Attending: Obstetrics and Gynecology | Admitting: Obstetrics and Gynecology

## 2022-06-07 ENCOUNTER — Encounter: Payer: Self-pay | Admitting: Obstetrics and Gynecology

## 2022-06-07 VITALS — BP 104/70 | HR 88 | Wt 177.1 lb

## 2022-06-07 DIAGNOSIS — Z3A35 35 weeks gestation of pregnancy: Secondary | ICD-10-CM | POA: Diagnosis present

## 2022-06-07 DIAGNOSIS — Z3483 Encounter for supervision of other normal pregnancy, third trimester: Secondary | ICD-10-CM

## 2022-06-07 DIAGNOSIS — Z113 Encounter for screening for infections with a predominantly sexual mode of transmission: Secondary | ICD-10-CM | POA: Diagnosis present

## 2022-06-07 DIAGNOSIS — Z3685 Encounter for antenatal screening for Streptococcus B: Secondary | ICD-10-CM

## 2022-06-07 LAB — POCT URINALYSIS DIPSTICK OB
Bilirubin, UA: NEGATIVE
Blood, UA: NEGATIVE
Glucose, UA: NEGATIVE
Ketones, UA: NEGATIVE
Leukocytes, UA: NEGATIVE
Nitrite, UA: NEGATIVE
POC,PROTEIN,UA: NEGATIVE
Spec Grav, UA: <=1.005 — AB (ref 1.010–1.025)
Urobilinogen, UA: 0.2 E.U./dL
pH, UA: 7 (ref 5.0–8.0)

## 2022-06-07 NOTE — Progress Notes (Signed)
ROB. Patient states daily fetal movement along with pressure. She states she lost her mucous plug last week. GC/CT and GBS cultures ordered. Patient states no questions or concerns at this time.

## 2022-06-07 NOTE — Progress Notes (Signed)
ROB: She has no complaints.  Is doing well.  Reports daily fetal movement.  Vitamins as directed.  She had some mucus come out and believes she lost her mucous plug last week.  She is no longer losing mucus.  GC/CT-GBS performed today.

## 2022-06-08 LAB — CERVICOVAGINAL ANCILLARY ONLY
Chlamydia: NEGATIVE
Comment: NEGATIVE
Comment: NORMAL
Neisseria Gonorrhea: NEGATIVE

## 2022-06-09 LAB — STREP GP B NAA+RFLX: Strep Gp B NAA+Rflx: NEGATIVE

## 2022-06-15 ENCOUNTER — Ambulatory Visit (INDEPENDENT_AMBULATORY_CARE_PROVIDER_SITE_OTHER): Payer: Medicaid Other | Admitting: Obstetrics and Gynecology

## 2022-06-15 VITALS — BP 118/66 | HR 87 | Wt 177.5 lb

## 2022-06-15 DIAGNOSIS — Z3483 Encounter for supervision of other normal pregnancy, third trimester: Secondary | ICD-10-CM

## 2022-06-15 DIAGNOSIS — Z3A36 36 weeks gestation of pregnancy: Secondary | ICD-10-CM

## 2022-06-15 LAB — POCT URINALYSIS DIPSTICK OB
Bilirubin, UA: NEGATIVE
Blood, UA: NEGATIVE
Glucose, UA: NEGATIVE
Ketones, UA: NEGATIVE
Leukocytes, UA: NEGATIVE
Nitrite, UA: NEGATIVE
POC,PROTEIN,UA: NEGATIVE
Spec Grav, UA: <=1.005 — AB (ref 1.010–1.025)
Urobilinogen, UA: 0.2 E.U./dL
pH, UA: 6 (ref 5.0–8.0)

## 2022-06-15 NOTE — Progress Notes (Signed)
ROB: Has no complaints.  Reviewed 36-week labs.  Feels daily fetal movement.  Has occasional Braxton Hicks contractions but "nothing strong".  Desires cervical check next visit. 

## 2022-06-15 NOTE — Progress Notes (Deleted)
ROB: Has no complaints.  Reviewed 36-week labs.  Feels daily fetal movement.  Has occasional Braxton Hicks contractions but "nothing strong".  Desires cervical check next visit.

## 2022-06-23 ENCOUNTER — Ambulatory Visit (INDEPENDENT_AMBULATORY_CARE_PROVIDER_SITE_OTHER): Payer: Medicaid Other | Admitting: Certified Nurse Midwife

## 2022-06-23 VITALS — BP 112/73 | HR 86 | Wt 174.7 lb

## 2022-06-23 DIAGNOSIS — Z3A37 37 weeks gestation of pregnancy: Secondary | ICD-10-CM

## 2022-06-23 DIAGNOSIS — Z3483 Encounter for supervision of other normal pregnancy, third trimester: Secondary | ICD-10-CM

## 2022-06-23 LAB — POCT URINALYSIS DIPSTICK OB
Bilirubin, UA: NEGATIVE
Blood, UA: NEGATIVE
Glucose, UA: NEGATIVE
Ketones, UA: NEGATIVE
Leukocytes, UA: NEGATIVE
Nitrite, UA: NEGATIVE
POC,PROTEIN,UA: NEGATIVE
Spec Grav, UA: >=1.03 — AB (ref 1.010–1.025)
Urobilinogen, UA: 0.2 E.U./dL
pH, UA: 5 (ref 5.0–8.0)

## 2022-06-23 NOTE — Patient Instructions (Signed)
Braxton Hicks Contractions  Contractions of the uterus can occur throughout pregnancy, but they are not always a sign that you are in labor. You may have practice contractions called Braxton Hicks contractions. These false labor contractions are sometimes confused with true labor. What are [place] contractions? Braxton Hicks contractions are tightening movements that occur in the muscles of the uterus before labor. Unlike true labor contractions, these contractions do not result in opening (dilation) and thinning of the lowest part of the uterus (cervix). Toward the end of pregnancy (32-34 weeks), Braxton Hicks contractions can happen more often and may become stronger. These contractions are sometimes difficult to tell apart from true labor because they can be very uncomfortable. How to tell the difference between true labor and false labor True labor Contractions last 30-70 seconds. Contractions become very regular. Discomfort is usually felt in the top of the uterus, and it spreads to the lower abdomen and low back. Contractions do not go away with walking. Contractions usually become stronger and more frequent. The cervix dilates and gets thinner. False labor Contractions are usually shorter, weaker, and farther apart than true labor contractions. Contractions are usually irregular. Contractions are often felt in the front of the lower abdomen and in the groin. Contractions may go away when you walk around or change positions while lying down. The cervix usually does not dilate or become thin. Sometimes, the only way to tell if you are in true labor is for your health care provider to look for changes in your cervix. Your health care provider will do a physical exam and may monitor your contractions. If you are in true labor, your health care provider will send you home with instructions about when to return to the hospital. You may continue to have Braxton Hicks contractions until you  go into true labor. Follow these instructions at home:  Take over-the-counter and prescription medicines only as told by your health care provider. If Braxton Hicks contractions are making you uncomfortable: Change your position from lying down or resting to walking, or change from walking to resting. Sit and rest in a tub of warm water. Drink enough fluid to keep your urine pale yellow. Dehydration may cause these contractions. Do slow and deep breathing several times an hour. Keep all follow-up visits. This is important. Contact a health care provider if: You have a fever. You have continuous pain in your abdomen. Your contractions become stronger, more regular, and closer together. You pass blood-tinged mucus. Get help right away if: You have fluid leaking or gushing from your vagina. You have bright red blood coming from your vagina. Your baby is not moving inside you as much as it used to. Summary You may have practice contractions called Braxton Hicks contractions. These false labor contractions are sometimes confused with true labor. Braxton Hicks contractions are usually shorter, weaker, farther apart, and less regular than true labor contractions. True labor contractions usually become stronger, more regular, and more frequent. Manage discomfort from [facility] contractions by changing position, resting in a warm bath, practicing deep breathing, and drinking plenty of water. Keep all follow-up visits. Contact your health care provider if your contractions become stronger, more regular, and closer together. This information is not intended to replace advice given to you by your health care provider. Make sure you discuss any questions you have with your health care provider. Document Revised: 03/08/2020 Document Reviewed: 03/08/2020 Elsevier Patient Education  [place].

## 2022-06-23 NOTE — Progress Notes (Signed)
Rob doing well, feeling good fetal movement. Request SVE 1cm/50/-2   Reviewed labor precautions. Follow up 1 wk .    Philip Aspen, CNM

## 2022-06-30 ENCOUNTER — Ambulatory Visit (INDEPENDENT_AMBULATORY_CARE_PROVIDER_SITE_OTHER): Payer: Medicaid Other | Admitting: Obstetrics

## 2022-06-30 VITALS — BP 106/65 | HR 86 | Wt 174.9 lb

## 2022-06-30 DIAGNOSIS — Z23 Encounter for immunization: Secondary | ICD-10-CM

## 2022-06-30 DIAGNOSIS — Z3480 Encounter for supervision of other normal pregnancy, unspecified trimester: Secondary | ICD-10-CM

## 2022-06-30 DIAGNOSIS — Z3483 Encounter for supervision of other normal pregnancy, third trimester: Secondary | ICD-10-CM

## 2022-06-30 DIAGNOSIS — Z3A38 38 weeks gestation of pregnancy: Secondary | ICD-10-CM

## 2022-06-30 LAB — POCT URINALYSIS DIPSTICK OB
Bilirubin, UA: NEGATIVE
Blood, UA: NEGATIVE
Glucose, UA: NEGATIVE
Ketones, UA: NEGATIVE
Leukocytes, UA: NEGATIVE
Nitrite, UA: NEGATIVE
POC,PROTEIN,UA: NEGATIVE
Spec Grav, UA: 1.01 (ref 1.010–1.025)
Urobilinogen, UA: 1 E.U./dL
pH, UA: 6.5 (ref 5.0–8.0)

## 2022-06-30 NOTE — Progress Notes (Signed)
ROB [redacted]w[redacted]d She has increased pelvic pressure and good fetal movement. She has started being nauseous again.

## 2022-06-30 NOTE — Progress Notes (Signed)
Routine Prenatal Care Visit  Subjective  Samantha Gordon is a 22 y.o. G2P1001 at 7w6dbeing seen today for ongoing prenatal care.  She is currently monitored for the following issues for this low-risk pregnancy and has Irregular contractions; Labor and delivery indication for care or intervention; Vaginitis; and Supervision of other normal pregnancy, antepartum on their problem list.  ----------------------------------------------------------------------------------- Patient reports  some pelvic pressure .   Contractions: Irritability. Vag. Bleeding: None.  Movement: Present. Leaking Fluid denies.  ----------------------------------------------------------------------------------- The following portions of the patient's history were reviewed and updated as appropriate: allergies, current medications, past family history, past medical history, past social history, past surgical history and problem list. Problem list updated.  Objective  Blood pressure 106/65, pulse 86, weight 174 lb 14.4 oz (79.3 kg), last menstrual period 10/01/2021. Pregravid weight 185 lb (83.9 kg) Total Weight Gain -10 lb 1.6 oz (-4.581 kg) Urinalysis: Urine Protein    Urine Glucose    Fetal Status:     Movement: Present     General:  Alert, oriented and cooperative. Patient is in no acute distress.  Skin: Skin is warm and dry. No rash noted.   Cardiovascular: Normal heart rate noted  Respiratory: Normal respiratory effort, no problems with respiration noted  Abdomen: Soft, gravid, appropriate for gestational age. Pain/Pressure: Present     Pelvic:   Cervix is very posterior, 1.5-2 cms/30%/-3. Vertex. Not terribly soft         Extremities: Normal range of motion.  Edema: None  Mental Status: Normal mood and affect. Normal behavior. Normal judgment and thought content.   Assessment   22y.o. G2P1001 at 346w6dy  07/08/2022, by Last Menstrual Period presenting for routine prenatal visit  Plan   second  Problems (from 01/04/22 to present)     No problems associated with this episode.        Term labor symptoms and general obstetric precautions including but not limited to vaginal bleeding, contractions, leaking of fluid and fetal movement were reviewed in detail with the patient. Please refer to After Visit Summary for other counseling recommendations.  Natural cervical ripeners discussed. RTC in 4-5 days for another VE and can set up IOL then for 41 weeks.  No follow-ups on file.  MaImagene RichesCNM  06/30/2022 4:20 PM

## 2022-06-30 NOTE — Patient Instructions (Signed)
Tdap (Tetanus, Diphtheria, Pertussis) Vaccine: What You Need to Know 1. Why get vaccinated? Tdap vaccine can prevent tetanus, diphtheria, and pertussis. Diphtheria and pertussis spread from person to person. Tetanus enters the body through cuts or wounds. TETANUS (T) causes painful stiffening of the muscles. Tetanus can lead to serious health problems, including being unable to open the mouth, having trouble swallowing and breathing, or death. DIPHTHERIA (D) can lead to difficulty breathing, heart failure, paralysis, or death. PERTUSSIS (aP), also known as "whooping cough," can cause uncontrollable, violent coughing that makes it hard to breathe, eat, or drink. Pertussis can be extremely serious especially in babies and young children, causing pneumonia, convulsions, brain damage, or death. In teens and adults, it can cause weight loss, loss of bladder control, passing out, and rib fractures from severe coughing. 2. Tdap vaccine Tdap is only for children 7 years and older, adolescents, and adults.  Adolescents should receive a single dose of Tdap, preferably at age 10 or 71 years. Pregnant people should get a dose of Tdap during every pregnancy, preferably during the early part of the third trimester, to help protect the newborn from pertussis. Infants are most at risk for severe, life-threatening complications from pertussis. Adults who have never received Tdap should get a dose of Tdap. Also, adults should receive a booster dose of either Tdap or Td (a different vaccine that protects against tetanus and diphtheria but not pertussis) every 10 years, or after 5 years in the case of a severe or dirty wound or burn. Tdap may be given at the same time as other vaccines. 3. Talk with your health care provider Tell your vaccine provider if the person getting the vaccine: Has had an allergic reaction after a previous dose of any vaccine that protects against tetanus, diphtheria, or pertussis, or has any  severe, life-threatening allergies Has had a coma, decreased level of consciousness, or prolonged seizures within 7 days after a previous dose of any pertussis vaccine (DTP, DTaP, or Tdap) Has seizures or another nervous system problem Has ever had Guillain-Barr Syndrome (also called "GBS") Has had severe pain or swelling after a previous dose of any vaccine that protects against tetanus or diphtheria In some cases, your health care provider may decide to postpone Tdap vaccination until a future visit. People with minor illnesses, such as a cold, may be vaccinated. People who are moderately or severely ill should usually wait until they recover before getting Tdap vaccine.  Your health care provider can give you more information. 4. Risks of a vaccine reaction Pain, redness, or swelling where the shot was given, mild fever, headache, feeling tired, and nausea, vomiting, diarrhea, or stomachache sometimes happen after Tdap vaccination. People sometimes faint after medical procedures, including vaccination. Tell your provider if you feel dizzy or have vision changes or ringing in the ears.  As with any medicine, there is a very remote chance of a vaccine causing a severe allergic reaction, other serious injury, or death. 5. What if there is a serious problem? An allergic reaction could occur after the vaccinated person leaves the clinic. If you see signs of a severe allergic reaction (hives, swelling of the face and throat, difficulty breathing, a fast heartbeat, dizziness, or weakness), call 9-1-1 and get the person to the nearest hospital. For other signs that concern you, call your health care provider.  Adverse reactions should be reported to the Vaccine Adverse Event Reporting System (VAERS). Your health care provider will usually file this report, or you  can do it yourself. Visit the VAERS website at www.vaers.hhs.gov or call 1-800-822-7967. VAERS is only for reporting reactions, and VAERS staff  members do not give medical advice. 6. The National Vaccine Injury Compensation Program The National Vaccine Injury Compensation Program (VICP) is a federal program that was created to compensate people who may have been injured by certain vaccines. Claims regarding alleged injury or death due to vaccination have a time limit for filing, which may be as short as two years. Visit the VICP website at www.hrsa.gov/vaccinecompensation or call 1-800-338-2382 to learn about the program and about filing a claim. 7. How can I learn more? Ask your health care provider. Call your local or state health department. Visit the website of the Food and Drug Administration (FDA) for vaccine package inserts and additional information at www.fda.gov/vaccines-blood-biologics/vaccines. Contact the Centers for Disease Control and Prevention (CDC): Call 1-800-232-4636 (1-800-CDC-INFO) or Visit CDC's website at www.cdc.gov/vaccines. Source: CDC Vaccine Information Statement Tdap (Tetanus, Diphtheria, Pertussis) Vaccine (12/19/2019) This same material is available at www.cdc.gov for no charge. This information is not intended to replace advice given to you by your health care provider. Make sure you discuss any questions you have with your health care provider. Document Revised: 03/29/2021 Document Reviewed: 01/31/2021 Elsevier Patient Education  2023 Elsevier Inc.  

## 2022-07-03 ENCOUNTER — Encounter: Payer: Medicaid Other | Admitting: Advanced Practice Midwife

## 2022-07-03 ENCOUNTER — Encounter: Payer: Self-pay | Admitting: Licensed Practical Nurse

## 2022-07-03 ENCOUNTER — Ambulatory Visit (INDEPENDENT_AMBULATORY_CARE_PROVIDER_SITE_OTHER): Payer: Medicaid Other | Admitting: Licensed Practical Nurse

## 2022-07-03 VITALS — BP 120/74 | HR 79 | Wt 180.3 lb

## 2022-07-03 DIAGNOSIS — Z3483 Encounter for supervision of other normal pregnancy, third trimester: Secondary | ICD-10-CM

## 2022-07-03 DIAGNOSIS — Z3A39 39 weeks gestation of pregnancy: Secondary | ICD-10-CM

## 2022-07-03 DIAGNOSIS — Z3480 Encounter for supervision of other normal pregnancy, unspecified trimester: Secondary | ICD-10-CM

## 2022-07-03 NOTE — Progress Notes (Signed)
Routine Prenatal Care Visit  Subjective  Samantha Gordon is a 22 y.o. G2P1001 at 50w2dbeing seen today for ongoing prenatal care.  She is currently monitored for the following issues for this low-risk pregnancy and has Irregular contractions; Labor and delivery indication for care or intervention; Vaginitis; and Supervision of other normal pregnancy, antepartum on their problem list.  ----------------------------------------------------------------------------------- Patient reports no complaints.  [place]ed part of her mucus plug, saw a spot of blood. Having a few B-H ctx's.  -Having painful BM, is straining, reports drinking enough fluids and fruits/veggies, rec increasing all may use a stool softener.  -reviewed postdates testing and IOL at 41wks, reviewed cervical ripening with Fb and Cytotec.   Contractions: Irritability. Vag. Bleeding: None.  Movement: Present. Leaking Fluid denies.  ----------------------------------------------------------------------------------- The following portions of the patient's history were reviewed and updated as appropriate: allergies, current medications, past family history, past medical history, past social history, past surgical history and problem list. Problem list updated.  Objective  Blood pressure (!) 117/101, pulse 79, weight 180 lb 4.8 oz (81.8 kg), last menstrual period 10/01/2021. Pregravid weight 185 lb (83.9 kg) Total Weight Gain -4 lb 11.2 oz (-2.132 kg) Urinalysis: Urine Protein    Urine Glucose    Fetal Status: Fetal Heart Rate (bpm): 120 Fundal Height: 39 cm Movement: Present  Presentation: Vertex  General:  Alert, oriented and cooperative. Patient is in no acute distress.  Skin: Skin is warm and dry. No rash noted.   Cardiovascular: Normal heart rate noted  Respiratory: Normal respiratory effort, no problems with respiration noted  Abdomen: Soft, gravid, appropriate for gestational age. Pain/Pressure: Absent      Pelvic:  Cervical exam performed Dilation: 1 Effacement (%): 50 Station: -2  Extremities: Normal range of motion.  Edema: None  Mental Status: Normal mood and affect. Normal behavior. Normal judgment and thought content.   Assessment   22y.o. G2P1001 at 322w2dy  07/08/2022, by Last Menstrual Period presenting for routine prenatal visit  Plan   second Problems (from 01/04/22 to present)     No problems associated with this episode.        Term labor symptoms and general obstetric precautions including but not limited to vaginal bleeding, contractions, leaking of fluid and fetal movement were reviewed in detail with the patient. Please refer to After Visit Summary for other counseling recommendations.   Return in about 1 week (around 07/10/2022) for ROB.  BPP ordered  IOL march 2 at 0800, orders placed   Samantha Gordon  07/03/22  1:09 PM

## 2022-07-04 ENCOUNTER — Inpatient Hospital Stay: Payer: Medicaid Other | Admitting: Registered Nurse

## 2022-07-04 ENCOUNTER — Inpatient Hospital Stay
Admission: EM | Admit: 2022-07-04 | Discharge: 2022-07-05 | DRG: 805 | Disposition: A | Discharge status: Home / Self Care | Payer: Medicaid Other | Source: Ambulatory Visit | Attending: Licensed Practical Nurse | Admitting: Licensed Practical Nurse

## 2022-07-04 ENCOUNTER — Encounter: Payer: Self-pay | Admitting: Anesthesiology

## 2022-07-04 ENCOUNTER — Encounter: Payer: Self-pay | Admitting: Obstetrics and Gynecology

## 2022-07-04 ENCOUNTER — Other Ambulatory Visit: Payer: Self-pay

## 2022-07-04 DIAGNOSIS — O411230 Chorioamnionitis, third trimester, not applicable or unspecified: Secondary | ICD-10-CM

## 2022-07-04 DIAGNOSIS — Z113 Encounter for screening for infections with a predominantly sexual mode of transmission: Secondary | ICD-10-CM | POA: Diagnosis present

## 2022-07-04 DIAGNOSIS — Z3480 Encounter for supervision of other normal pregnancy, unspecified trimester: Secondary | ICD-10-CM

## 2022-07-04 DIAGNOSIS — O2613 Low weight gain in pregnancy, third trimester: Secondary | ICD-10-CM | POA: Diagnosis not present

## 2022-07-04 DIAGNOSIS — O4202 Full-term premature rupture of membranes, onset of labor within 24 hours of rupture: Secondary | ICD-10-CM | POA: Diagnosis not present

## 2022-07-04 DIAGNOSIS — D62 Acute posthemorrhagic anemia: Secondary | ICD-10-CM | POA: Diagnosis not present

## 2022-07-04 DIAGNOSIS — Z3490 Encounter for supervision of normal pregnancy, unspecified, unspecified trimester: Principal | ICD-10-CM | POA: Diagnosis present

## 2022-07-04 DIAGNOSIS — O26893 Other specified pregnancy related conditions, third trimester: Secondary | ICD-10-CM | POA: Diagnosis present

## 2022-07-04 DIAGNOSIS — O9902 Anemia complicating childbirth: Secondary | ICD-10-CM | POA: Diagnosis present

## 2022-07-04 DIAGNOSIS — Z3A39 39 weeks gestation of pregnancy: Secondary | ICD-10-CM

## 2022-07-04 HISTORY — DX: Other specified health status: Z78.9

## 2022-07-04 LAB — CBC
HCT: 31.3 % — ABNORMAL LOW (ref 36.0–46.0)
Hemoglobin: 10.1 g/dL — ABNORMAL LOW (ref 12.0–15.0)
MCH: 26.6 pg (ref 26.0–34.0)
MCHC: 32.3 g/dL (ref 30.0–36.0)
MCV: 82.6 fL (ref 80.0–100.0)
Platelets: 213 10*3/uL (ref 150–400)
RBC: 3.79 MIL/uL — ABNORMAL LOW (ref 3.87–5.11)
RDW: 13.8 % (ref 11.5–15.5)
WBC: 17.2 10*3/uL — ABNORMAL HIGH (ref 4.0–10.5)
nRBC: 0 % (ref 0.0–0.2)

## 2022-07-04 LAB — TYPE AND SCREEN
ABO/RH(D): B POS
Antibody Screen: NEGATIVE

## 2022-07-04 MED ORDER — DIPHENHYDRAMINE HCL 50 MG/ML IJ SOLN: 12.5000 mg | INTRAMUSCULAR | Status: DC | PRN

## 2022-07-04 MED ORDER — BUPIVACAINE HCL (PF) 0.25 % IJ SOLN
INTRAMUSCULAR | Status: DC | PRN
  Administered 2022-07-04: 6 mL via EPIDURAL

## 2022-07-04 MED ORDER — LIDOCAINE HCL (PF) 1 % IJ SOLN
INTRAMUSCULAR | Status: DC | PRN
  Administered 2022-07-04: 3 mL via SUBCUTANEOUS

## 2022-07-04 MED ORDER — EPHEDRINE 5 MG/ML INJ
10.0000 mg | INTRAVENOUS | Status: DC | PRN
  Filled 2022-07-04: qty 5

## 2022-07-04 MED ORDER — ACETAMINOPHEN 500 MG PO TABS
1000.0000 mg | ORAL_TABLET | Freq: Four times a day (QID) | ORAL | Status: DC | PRN
  Administered 2022-07-04 – 2022-07-05 (×2): 1000 mg via ORAL
  Filled 2022-07-04 (×2): qty 2

## 2022-07-04 MED ORDER — DOCUSATE SODIUM 100 MG PO CAPS
100.0000 mg | ORAL_CAPSULE | Freq: Two times a day (BID) | ORAL | Status: DC
  Administered 2022-07-04 – 2022-07-05 (×2): 100 mg via ORAL
  Filled 2022-07-04 (×2): qty 1

## 2022-07-04 MED ORDER — LIDOCAINE HCL (PF) 1 % IJ SOLN
30.0000 mL | INTRAMUSCULAR | Status: DC | PRN
  Filled 2022-07-04: qty 30

## 2022-07-04 MED ORDER — EPHEDRINE 5 MG/ML INJ: 10.0000 mg | INTRAVENOUS | Status: DC | PRN

## 2022-07-04 MED ORDER — IBUPROFEN 600 MG PO TABS
ORAL_TABLET | ORAL | Status: AC
  Administered 2022-07-04: 600 mg
  Filled 2022-07-04: qty 1

## 2022-07-04 MED ORDER — OXYTOCIN-SODIUM CHLORIDE 30-0.9 UT/500ML-% IV SOLN: 1.0000 m[IU]/min | INTRAVENOUS | Status: DC

## 2022-07-04 MED ORDER — OXYTOCIN-SODIUM CHLORIDE 30-0.9 UT/500ML-% IV SOLN
2.5000 [IU]/h | INTRAVENOUS | Status: DC
  Filled 2022-07-04: qty 500

## 2022-07-04 MED ORDER — FENTANYL-BUPIVACAINE-NACL 0.5-0.125-0.9 MG/250ML-% EP SOLN
EPIDURAL | Status: AC
  Filled 2022-07-04: qty 250

## 2022-07-04 MED ORDER — OXYCODONE HCL 5 MG PO TABS: 10.0000 mg | ORAL_TABLET | ORAL | Status: DC | PRN

## 2022-07-04 MED ORDER — PHENYLEPHRINE 80 MCG/ML (10ML) SYRINGE FOR IV PUSH (FOR BLOOD PRESSURE SUPPORT): 80.0000 ug | PREFILLED_SYRINGE | INTRAVENOUS | Status: DC | PRN

## 2022-07-04 MED ORDER — DIBUCAINE (PERIANAL) 1 % EX OINT: 1.0000 | TOPICAL_OINTMENT | CUTANEOUS | Status: DC | PRN

## 2022-07-04 MED ORDER — GENTAMICIN SULFATE 40 MG/ML IJ SOLN
5.0000 mg/kg | Freq: Once | INTRAVENOUS | Status: AC
  Administered 2022-07-04: 310 mg via INTRAVENOUS
  Filled 2022-07-04: qty 7.75

## 2022-07-04 MED ORDER — PRENATAL MULTIVITAMIN CH
1.0000 | ORAL_TABLET | Freq: Every day | ORAL | Status: DC
  Administered 2022-07-05: 1 via ORAL
  Filled 2022-07-04: qty 1

## 2022-07-04 MED ORDER — OXYTOCIN 10 UNIT/ML IJ SOLN
INTRAMUSCULAR | Status: AC
  Filled 2022-07-04: qty 2

## 2022-07-04 MED ORDER — AMMONIA AROMATIC IN INHA
RESPIRATORY_TRACT | Status: AC
  Filled 2022-07-04: qty 10

## 2022-07-04 MED ORDER — DIPHENHYDRAMINE HCL 25 MG PO CAPS: 25.0000 mg | ORAL_CAPSULE | Freq: Four times a day (QID) | ORAL | Status: DC | PRN

## 2022-07-04 MED ORDER — OXYCODONE HCL 5 MG PO TABS
5.0000 mg | ORAL_TABLET | ORAL | Status: DC | PRN
  Administered 2022-07-05: 5 mg via ORAL
  Filled 2022-07-04: qty 1

## 2022-07-04 MED ORDER — COCONUT OIL OIL: 1.0000 | TOPICAL_OIL | Status: DC | PRN

## 2022-07-04 MED ORDER — CLINDAMYCIN PHOSPHATE 900 MG/50ML IV SOLN
900.0000 mg | Freq: Once | INTRAVENOUS | Status: AC
  Administered 2022-07-04: 900 mg via INTRAVENOUS
  Filled 2022-07-04: qty 50

## 2022-07-04 MED ORDER — TERBUTALINE SULFATE 1 MG/ML IJ SOLN: 0.2500 mg | Freq: Once | INTRAMUSCULAR | Status: DC | PRN

## 2022-07-04 MED ORDER — HYDROXYZINE HCL 25 MG PO TABS: 50.0000 mg | ORAL_TABLET | Freq: Four times a day (QID) | ORAL | Status: DC | PRN

## 2022-07-04 MED ORDER — LACTATED RINGERS IV SOLN: 500.0000 mL | INTRAVENOUS | Status: DC | PRN

## 2022-07-04 MED ORDER — SIMETHICONE 80 MG PO CHEW: 80.0000 mg | CHEWABLE_TABLET | ORAL | Status: DC | PRN

## 2022-07-04 MED ORDER — LACTATED RINGERS IV SOLN: INTRAVENOUS | Status: DC

## 2022-07-04 MED ORDER — ACETAMINOPHEN 500 MG PO TABS
ORAL_TABLET | ORAL | Status: AC
  Filled 2022-07-04: qty 2

## 2022-07-04 MED ORDER — ONDANSETRON HCL 4 MG/2ML IJ SOLN: 4.0000 mg | Freq: Four times a day (QID) | INTRAMUSCULAR | Status: DC | PRN

## 2022-07-04 MED ORDER — MISOPROSTOL 200 MCG PO TABS
ORAL_TABLET | ORAL | Status: AC
  Administered 2022-07-04: 800 ug via RECTAL
  Filled 2022-07-04: qty 4

## 2022-07-04 MED ORDER — FENTANYL CITRATE (PF) 100 MCG/2ML IJ SOLN
50.0000 ug | INTRAMUSCULAR | Status: DC | PRN
  Administered 2022-07-04: 100 ug via INTRAVENOUS
  Administered 2022-07-04: 50 ug via INTRAVENOUS
  Filled 2022-07-04 (×2): qty 2

## 2022-07-04 MED ORDER — LIDOCAINE-EPINEPHRINE (PF) 1.5 %-1:200000 IJ SOLN
INTRAMUSCULAR | Status: DC | PRN
  Administered 2022-07-04: 3 mL via PERINEURAL

## 2022-07-04 MED ORDER — SOD CITRATE-CITRIC ACID 500-334 MG/5ML PO SOLN: 30.0000 mL | ORAL | Status: DC | PRN

## 2022-07-04 MED ORDER — METHYLERGONOVINE MALEATE 0.2 MG/ML IJ SOLN
INTRAMUSCULAR | Status: AC
  Filled 2022-07-04: qty 1

## 2022-07-04 MED ORDER — IBUPROFEN 600 MG PO TABS
600.0000 mg | ORAL_TABLET | Freq: Four times a day (QID) | ORAL | Status: DC
  Administered 2022-07-05 (×3): 600 mg via ORAL
  Filled 2022-07-04 (×3): qty 1

## 2022-07-04 MED ORDER — ZOLPIDEM TARTRATE 5 MG PO TABS: 5.0000 mg | ORAL_TABLET | Freq: Every evening | ORAL | Status: DC | PRN

## 2022-07-04 MED ORDER — LACTATED RINGERS IV SOLN
500.0000 mL | Freq: Once | INTRAVENOUS | Status: AC
  Administered 2022-07-04: 500 mL via INTRAVENOUS

## 2022-07-04 MED ORDER — OXYTOCIN BOLUS FROM INFUSION
333.0000 mL | Freq: Once | INTRAVENOUS | Status: AC
  Administered 2022-07-04: 333 mL via INTRAVENOUS

## 2022-07-04 MED ORDER — WITCH HAZEL-GLYCERIN EX PADS: 1.0000 | MEDICATED_PAD | CUTANEOUS | Status: DC | PRN

## 2022-07-04 MED ORDER — ONDANSETRON HCL 4 MG PO TABS: 4.0000 mg | ORAL_TABLET | ORAL | Status: DC | PRN

## 2022-07-04 MED ORDER — ONDANSETRON HCL 4 MG/2ML IJ SOLN: 4.0000 mg | INTRAMUSCULAR | Status: DC | PRN

## 2022-07-04 MED ORDER — FENTANYL 2.5 MCG/ML W/ROPIVACAINE 0.15% IN NS 100 ML EPIDURAL (ARMC)
EPIDURAL | Status: DC | PRN
  Administered 2022-07-04: 12 mL/h via EPIDURAL

## 2022-07-04 MED ORDER — BENZOCAINE-MENTHOL 20-0.5 % EX AERO: 1.0000 | INHALATION_SPRAY | CUTANEOUS | Status: DC | PRN

## 2022-07-04 MED ORDER — FENTANYL-BUPIVACAINE-NACL 0.5-0.125-0.9 MG/250ML-% EP SOLN: 12.0000 mL/h | EPIDURAL | Status: DC | PRN

## 2022-07-04 NOTE — H&P (Signed)
OB History & Physical   History of Present Illness:  Chief Complaint:   HPI:  Aldine Wedekind is a 22 y.o. G76P1001 female at 58w3ddated by LMP.  She presents to L&D for SROM and contractions. She woke up just after 6 am feeling wet, contractions started around 0730 and were intense and frequent by 8.  She continues to leak clear fluid. She wore a pad in that is saturated. She endorse +FM. Denies vaginal bleeding.  [place] She has had regular prenatal care. Her pregnancy has been uncomplicated.    Pregnancy Issues: 1. Poor weight gain in pregnancy TWG 1 kg 2. BMI 31.94   Maternal Medical History:  No past medical history on file.  No past surgical history on file.  Allergies  Allergen Reactions   Penicillins Rash    Prior to Admission medications   Medication Sig Start Date End Date Taking? Authorizing Provider  Prenatal Vit-Fe Fumarate-FA (MULTIVITAMIN-PRENATAL) 27-0.8 MG TABS tablet Take 1 tablet by mouth daily at 12 noon.    [provider]     Prenatal care site: [facility]   Social History: She  reports that she has never smoked. She has never used smokeless tobacco. She reports that she does not drink alcohol and does not use drugs.  Family History: family history includes Diabetes in her paternal grandmother; Seizures in her maternal grandmother.   Review of Systems: A full review of systems was performed and negative except as noted in the HPI.     Physical Exam:  Vital Signs: BP 116/73 (BP Location: Left Arm)   Pulse 83   Temp 98.7 F (37.1 C) (Oral)   Resp 18   LMP 10/01/2021 (Exact Date)  General: no acute distress.  HEENT: normocephalic, atraumatic Heart: regular rate & rhythm.  No murmurs/rubs/gallops Lungs: clear to auscultation bilaterally, normal respiratory effort Abdomen: soft, gravid, non-tender;  EFW: 6.5lbs Pelvic:   External: Normal external female genitalia  Cervix: Dilation: 3  / Effacement (%): 70 / Station: -2    Extremities: non-tender, symmetric, trace edema bilaterally.   Neurologic: Alert & oriented x 3.    No results found for this or any previous visit (from the past 24 hour(s)).  Pertinent Results:  Prenatal Labs: Blood type/Rh B+  Antibody screen neg  Rubella Immune  Varicella Immune  RPR NR  HBsAg Neg  HIV NR  GC neg  Chlamydia neg  Genetic screening negative  1 hour GTT 150  3 hour GTT 74, 155, 121, 91  GBS neg   FHT: baseline 130, moderate variability, pos accel, neg decel  TOCO:q 2-4 SVE:  Dilation: 3 / Effacement (%): 70 / Station: -2    Cephalic by leopolds  No results found.  Assessment:  JLaterra Pipkinis a 22y.o. GG69P1001female at 387w3dith SROM.   Plan:  Admit to Labor & Delivery CBC, T&S, regular diet GBS  neg Consents obtained. Intermittent monitoring Pain management: aware of all options, will asked if desired   Dr ChMarcelline Matesotified of admission  ----- LyRoberto ScalesCNBurneyville

## 2022-07-04 NOTE — Anesthesia Preprocedure Evaluation (Signed)
Anesthesia Evaluation  Patient identified by MRN, date of birth, ID band Patient awake    Reviewed: Allergy & Precautions, H&P , NPO status , Patient's Chart, lab work & pertinent test results  Airway Mallampati: II       Dental   Pulmonary neg pulmonary ROS          Cardiovascular negative cardio ROS      Neuro/Psych    GI/Hepatic negative GI ROS, Neg liver ROS,,,  Endo/Other  negative endocrine ROS    Renal/GU negative Renal ROS     Musculoskeletal   Abdominal   Peds  Hematology negative hematology ROS (+)   Anesthesia Other Findings   Reproductive/Obstetrics (+) Pregnancy                              Anesthesia Physical Anesthesia Plan  ASA: 2  Anesthesia Plan: Epidural   Post-op Pain Management:    Induction:   PONV Risk Score and Plan:   Airway Management Planned:   Additional Equipment:   Intra-op Plan:   Post-operative Plan:   Informed Consent: I have reviewed the patients History and Physical, chart, labs and discussed the procedure including the risks, benefits and alternatives for the proposed anesthesia with the patient or authorized representative who has indicated his/her understanding and acceptance.       Plan Discussed with: Anesthesiologist and CRNA  Anesthesia Plan Comments:          Anesthesia Quick Evaluation

## 2022-07-04 NOTE — Discharge Instructions (Signed)

## 2022-07-04 NOTE — Progress Notes (Addendum)
[facility] at [facility] called out earlier reporting pt had a boggy uterus and about 122m of blood was on her pad-instructed to give Cytotec, RN called again reporting pt has an elevated temp of 101.5. Pt received 1,0018mTylenol near the time of birth.   SUBJECTIVE:  Pt resting comfortably in bed, infant in bassinet. Pt received Cytotec "about 20 mins ago"  OBJECTIVE:  BP (!) 107/49   Pulse (!) 114   Temp (!) 101.5 F (38.6 C) (Oral)   Resp 18   Ht 5' 2"$  (1.575 m)   Wt 81.6 kg   LMP 10/01/2021 (Exact Date)   SpO2 97%   Breastfeeding Unknown   BMI 32.92 kg/m   FF M U/1, minimal bleeding noted with fundal massage  ASSESSMENT: Chorioamnionitis  PLAN: -Clindamycin and gentamicin x1 ordered -Reviewed with pt presence of fever, recommend  treatment with above antibiotics, pt agreeable to plan   -Dr ChMarcelline Matesware of [place]  2/202/2024 9:00 PM

## 2022-07-04 NOTE — Progress Notes (Signed)
   Subjective:  Pt more uncomfortable. Has received IV Fentanyl x 2, now sitting up for epidural.   Objective:   Vitals: Blood pressure 116/73, pulse 83, temperature 98.5 F (36.9 C), temperature source Oral, resp. rate 18, height 5' 2"$  (1.575 m), weight 81.6 kg, last menstrual period 10/01/2021. General: NAD Abdomen: Cervical Exam:  Dilation: 5 Effacement (%): 80 Cervical Position: Middle Station: -1 Presentation: Vertex Exam by:: Stark Klein RN  FHT: baseline 140, moderate variability, pos accel, neg decel  Toco:q 1.5-3.5  Results for orders placed or performed during the hospital encounter of 07/04/22 (from the past 24 hour(s))  Type and screen     Status: None   Collection Time: 07/04/22 11:55 AM  Result Value Ref Range   ABO/RH(D) B POS    Antibody Screen NEG    Sample Expiration      07/07/2022,2359 Performed at [facility], [address] [place], [place] [address]   CBC     Status: Abnormal   Collection Time: 07/04/22 12:04 PM  Result Value Ref Range   WBC 17.2 (H) 4.0 - 10.5 K/uL   RBC 3.79 (L) 3.87 - 5.11 MIL/uL   Hemoglobin 10.1 (L) 12.0 - 15.0 g/dL   HCT 31.3 (L) 36.0 - 46.0 %   MCV 82.6 80.0 - 100.0 fL   MCH 26.6 26.0 - 34.0 pg   MCHC 32.3 30.0 - 36.0 g/dL   RDW 13.8 11.5 - 15.5 %   Platelets 213 150 - 400 K/uL   nRBC 0.0 0.0 - 0.2 %    Assessment:   22 y.o. G2P1001 92w3dadmitted for SROM  Plan:   1) Labor -early active labor, making cervical change   2) Fetus - category 1 tracing  3) GSB neg, SROM for clear around 0600  4) Pain management: Anesthesia present for epidural   LRoberto Scales CScotch MeadowsGroup  07/04/2022 3:28 PM

## 2022-07-04 NOTE — Discharge Summary (Signed)
Obstetrical Discharge Summary  Date of Admission: 07/04/2022 Date of Discharge: 07/05/2022  Primary OB: [facility]   Gestational Age at Delivery: [redacted]w[redacted]d  Antepartum complications: none Reason for Admission: SROM Date of Delivery:07/04/2022 Delivered By: LLawanda Cousins CNM  Delivery Type: spontaneous vaginal delivery Intrapartum complications/course: Chorio  Anesthesia: epidural Placenta: Delivered and expressed via active management. Intact: yes. To pathology: yes Laceration: none Episiotomy: none EBL: 5022mBaby: Liveborn female, APGARs 8/9, weight 3880 g.    Discharge Diagnosis: Delivered.   Postpartum course: she is tolerating regular diet, her pain is controlled with PO medications, she is ambulating and voiding without difficulty.  Discharge Vital Signs:  Current Vital Signs 24h Vital Sign Ranges  T 98.3 F (36.8 C) Temp  Avg: 99 F (37.2 C)  Min: 97.6 F (36.4 C)  Max: 101.5 F (38.6 C)  BP 113/66 BP  Min: 94/59  Max: 145/74  HR 84 Pulse  Avg: 112.9  Min: 75  Max: 145  RR 15 Resp  Avg: 16.4  Min: 14  Max: 20  SaO2 100 %   SpO2  Avg: 97.3 %  Min: 95 %  Max: 100 %       24 Hour I/O Current Shift I/O  Time Ins Outs 02/20 0701 - 02/21 0700 In: 305.7 [I.V.:205.7] Out: 1500 [Urine:900] No intake/output data recorded.     Patient Vitals for the past 6 hrs:  BP Temp Temp src Pulse Resp SpO2  07/05/22 1542 113/66 98.3 F (36.8 C) Oral 84 15 100 %    Discharge Exam:  NAD Perineum: intact Abdomen: firm fundus below the umbilicus, NTTP, non distended, +bowel sounds.  Incision: NA RRR no MRGs CTAB Ext: no c/c/e  Recent Labs  Lab 07/04/22 1204 07/05/22 0552  WBC 17.2* 26.2*  HGB 10.1* 8.2*  HCT 31.3* 25.4*  PLT 213 189    Disposition: Home  Rh Immune globulin given: no Rubella vaccine given: no Tdap vaccine given in AP or PP setting: yes Flu vaccine given in AP or PP setting: yes  Contraception:  undecided   Prenatal/Postnatal Panel: B POS//Rubella  Immune//Varicella Immune//RPR negative//HIV negative/HepB Surface Ag negative//pap  never collected d/t age //formula  Plan:  JaJonquil Piereas discharged to home in good condition. Follow-up appointment with LMD in 2 weeks for a virtual visit and then in 6wks for in person  visit  Future Appointments  Date Time Provider [place]2/26/2024  8:15 AM FrImagene RichesCNM AOB-AOB None    Discharge Medications: Allergies as of 07/05/2022       Reactions   Penicillins Rash        Medication List     TAKE these medications    multivitamin-prenatal 27-0.8 MG Tabs tablet Take 1 tablet by mouth daily at 12 noon.        JaChristean LeafCNM [facility] 07/05/2022, 6:51 PM

## 2022-07-04 NOTE — OB Triage Note (Signed)
Pt arrived to L/D- grossly ruptured membranes at 0630. Pt reports painful, regular contractions beginning around 0830. Pt reports positive fetal movement and no bleeding.  CNM aware of patient's arrival- pt to be admitted to L/D with intermittent monitoring. VSS.

## 2022-07-04 NOTE — Anesthesia Procedure Notes (Signed)
Epidural Patient location during procedure: OB Start time: 07/04/2022 3:28 PM End time: 07/04/2022 3:32 PM  Staffing Resident/CRNA: Lia Foyer, CRNA Performed: resident/CRNA   Preanesthetic Checklist Completed: patient identified, IV checked, site marked, risks and benefits discussed, monitors and equipment checked, pre-op evaluation and timeout performed  Epidural Patient position: sitting Prep: ChloraPrep Patient monitoring: heart rate, continuous pulse ox and blood pressure Approach: midline Location: L4-L5 Injection technique: LOR saline  Needle:  Needle type: Tuohy  Needle gauge: 18 G Needle length: 9 cm and 9 Needle insertion depth: 5 cm Catheter type: closed end flexible Catheter size: 20 Guage Catheter at skin depth: 9 cm Test dose: negative and 1.5% lidocaine with Epi 1:200 K  Assessment Events: blood not aspirated, no cerebrospinal fluid, injection not painful, no injection resistance, no paresthesia and negative IV test  Additional Notes   Patient tolerated the insertion well without complications.Reason for block:procedure for pain

## 2022-07-05 LAB — CBC
HCT: 25.4 % — ABNORMAL LOW (ref 36.0–46.0)
Hemoglobin: 8.2 g/dL — ABNORMAL LOW (ref 12.0–15.0)
MCH: 26.6 pg (ref 26.0–34.0)
MCHC: 32.3 g/dL (ref 30.0–36.0)
MCV: 82.5 fL (ref 80.0–100.0)
Platelets: 189 10*3/uL (ref 150–400)
RBC: 3.08 MIL/uL — ABNORMAL LOW (ref 3.87–5.11)
RDW: 13.9 % (ref 11.5–15.5)
WBC: 26.2 10*3/uL — ABNORMAL HIGH (ref 4.0–10.5)
nRBC: 0 % (ref 0.0–0.2)

## 2022-07-05 LAB — RPR: RPR Ser Ql: NONREACTIVE

## 2022-07-05 NOTE — Anesthesia Postprocedure Evaluation (Signed)
Anesthesia Post Note  Patient: Samantha Gordon  Procedure(s) Performed: AN AD [place]  Patient location during evaluation: Mother Baby Anesthesia Type: Epidural Level of consciousness: awake and alert Pain management: pain level controlled Vital Signs Assessment: post-procedure vital signs reviewed and stable Respiratory status: spontaneous breathing, nonlabored ventilation and respiratory function stable Cardiovascular status: stable Postop Assessment: no headache, no backache and epidural receding Anesthetic complications: no   No notable events documented.   Last Vitals:  Vitals:   07/05/22 0300 07/05/22 0759  BP: 104/67 (!) 103/53  Pulse: 84 75  Resp: 18 14  Temp: 37.1 C 36.4 C  SpO2: 100% 100%    Last Pain:  Vitals:   07/05/22 0759  TempSrc:   PainSc: 5                  Monseratt Ledin,  Clearnce Sorrel

## 2022-07-05 NOTE — Progress Notes (Signed)
Discharge instructions and appointments reviewed with patient. No questions or concerns. All belongings sent home with patient. Patient wheeled down by this RN with dad and infant at side.

## 2022-07-05 NOTE — Addendum Note (Signed)
Addendum  created 07/05/22 [id number] by Donnella Bi, RN   Procedure Navigator section edited

## 2022-07-05 NOTE — Progress Notes (Signed)
[facility] Subjective:  Doing well postpartum day 1; she is tolerating regular diet, her pain is controlled with PO medications, she is ambulating and voiding without difficulty. She denies s/s of fever.  Objective:  Vital signs in last 24 hours: Temp:  [97.6 F (36.4 C)-101.5 F (38.6 C)] 97.6 F (36.4 C) (02/21 0759) Pulse Rate:  [75-145] 75 (02/21 0759) Resp:  [14-20] 14 (02/21 0759) BP: (88-145)/(37-109) 103/53 (02/21 0759) SpO2:  [95 %-100 %] 100 % (02/21 0759) Weight:  [81.6 kg] 81.6 kg (02/20 1141)    General: NAD Pulmonary: no increased work of breathing Abdomen: non-distended, non-tender, fundus firm at level of umbilicus Extremities: no edema, no erythema, no tenderness  Results for orders placed or performed during the hospital encounter of 07/04/22 (from the past 72 hour(s))  Type and screen     Status: None   Collection Time: 07/04/22 11:55 AM  Result Value Ref Range   ABO/RH(D) B POS    Antibody Screen NEG    Sample Expiration      07/07/2022,2359 Performed at [facility], [address] [place], [place] [address]   CBC     Status: Abnormal   Collection Time: 07/04/22 12:04 PM  Result Value Ref Range   WBC 17.2 (H) 4.0 - 10.5 K/uL   RBC 3.79 (L) 3.87 - 5.11 MIL/uL   Hemoglobin 10.1 (L) 12.0 - 15.0 g/dL   HCT 31.3 (L) 36.0 - 46.0 %   MCV 82.6 80.0 - 100.0 fL   MCH 26.6 26.0 - 34.0 pg   MCHC 32.3 30.0 - 36.0 g/dL   RDW 13.8 11.5 - 15.5 %   Platelets 213 150 - 400 K/uL   nRBC 0.0 0.0 - 0.2 %    Comment: Performed at [facility], [address] [place], [place] [address]  RPR     Status: None   Collection Time: 07/04/22 12:04 PM  Result Value Ref Range   RPR Ser Ql NON REACTIVE NON REACTIVE    Comment: Performed at [facility], [address], [place], [place] [address]  CBC     Status: Abnormal   Collection Time: 07/05/22  5:52 AM  Result Value Ref Range   WBC 26.2 (H) 4.0 - 10.5 K/uL   RBC 3.08 (L) 3.87 - 5.11 MIL/uL    Hemoglobin 8.2 (L) 12.0 - 15.0 g/dL   HCT 25.4 (L) 36.0 - 46.0 %   MCV 82.5 80.0 - 100.0 fL   MCH 26.6 26.0 - 34.0 pg   MCHC 32.3 30.0 - 36.0 g/dL   RDW 13.9 11.5 - 15.5 %   Platelets 189 150 - 400 K/uL   nRBC 0.0 0.0 - 0.2 %    Comment: Performed at [facility], [address], [place], [place] [address]    Assessment:   22 y.o. [phone] postpartum day # 1  Plan:    1) Acute blood loss anemia - hemodynamically stable and asymptomatic - po ferrous sulfate  2) Blood Type --/--/B POS (02/20 1155) / Rubella 1.77 (08/28 1437) / Varicella Immune  3) TDAP status up to date  4) Feeding plan  formula  5)  Education given regarding options for contraception, as well as compatibility with breast feeding if applicable.  Patient plans on IUD for contraception. Undecided Mirena vs Paragard  6) Disposition: continue current care   Rod Can, [facility] 07/05/2022, 9:22 AM

## 2022-07-08 ENCOUNTER — Inpatient Hospital Stay: Admit: 2022-07-08 | Payer: Self-pay

## 2022-07-10 ENCOUNTER — Encounter: Payer: Self-pay | Admitting: Licensed Practical Nurse

## 2022-07-10 ENCOUNTER — Encounter: Payer: Medicaid Other | Admitting: Obstetrics

## 2022-07-12 ENCOUNTER — Encounter: Payer: Self-pay | Admitting: Obstetrics

## 2022-07-19 ENCOUNTER — Telehealth: Payer: Medicaid Other | Admitting: Licensed Practical Nurse

## 2022-08-21 ENCOUNTER — Ambulatory Visit: Payer: Medicaid Other | Admitting: Licensed Practical Nurse

## 2022-09-01 NOTE — Progress Notes (Unsigned)
   OBSTETRICS POSTPARTUM CLINIC PROGRESS NOTE  Subjective:     Samantha Gordon is a 22 y.o. [phone] female who presents for a postpartum visit. She is 9 weeks postpartum following a spontaneous vaginal delivery. I have fully reviewed the prenatal and intrapartum course. The delivery was at [redacted]w[redacted]d  gestational weeks.  Anesthesia: epidural. Postpartum course has been normal. Baby's course has been normal. Baby is feeding by {breast/bottle:69}. Bleeding: patient {HAS HAS AVW:09811} not resumed menses, with No LMP recorded.. Bowel function is {normal:32111}. Bladder function is {normal:32111}. Patient {is/is not:9024} sexually active. Contraception method desired is {contraceptive method:5051}. Postpartum depression screening: {neg default:13464::"negative"}.  EDPS score is ***.    The following portions of the patient's history were reviewed and updated as appropriate: allergies, current medications, past family history, past medical history, past social history, past surgical history, and problem list.  Review of Systems {ros; complete:30496}   Objective:    There were no vitals taken for this visit.  General:  alert and no distress   Breasts:  inspection negative, no nipple discharge or bleeding, no masses or nodularity palpable  Lungs: clear to auscultation bilaterally  Heart:  regular rate and rhythm, S1, S2 normal, no murmur, click, rub or gallop  Abdomen: soft, non-tender; bowel sounds normal; no masses,  no organomegaly.  ***Well healed Pfannenstiel incision   Vulva:  normal  Vagina: normal vagina, no discharge, exudate, lesion, or erythema  Cervix:  no cervical motion tenderness and no lesions  Corpus: normal size, contour, position, consistency, mobility, non-tender  Adnexa:  normal adnexa and no mass, fullness, tenderness  Rectal Exam: Not performed.         Labs:  Lab Results  Component Value Date   HGB 8.2 (L) 07/05/2022     Assessment:   No diagnosis found.    Plan:    1. Contraception: {method:5051} 2. Will check Hgb for h/o postpartum anemia of less than 10.  3. Follow up in: {1-10:13787} {time; units:19136} or as needed.    Paula Compton CNM [facility]

## 2022-09-01 NOTE — Patient Instructions (Signed)
Postpartum Care After Vaginal Delivery The following information offers guidance about how to care for yourself from the time you deliver your baby to 6-12 weeks after delivery (postpartum period). If you have problems or questions, contact your health care provider for more specific instructions. Follow these instructions at home: Vaginal bleeding It is normal to have vaginal bleeding (lochia) after delivery. Wear a sanitary pad for bleeding and discharge. During the first week after delivery, the amount and appearance of lochia is often similar to a menstrual period. Over the next few weeks, it will gradually decrease to a dry, yellow-brown discharge. For most women, lochia stops completely by 4-6 weeks after delivery, but it can vary. Change your sanitary pads frequently. Watch for any changes in your flow, such as: An increase in bleeding. A change in color. Large blood clots. If you pass a blood clot the size of an egg or larger, contact your healthcare provider.. Do not use tampons or douches until your health care provider approves. If you are not breastfeeding, your period should return 6-8 weeks after delivery. If you are feeding your baby breast milk only, your period may not return until you stop breastfeeding. Perineal care  Keep the area between the vagina and the anus (perineum) clean and dry. Use medicated pads and pain-relieving sprays or creams as told. If you had a tear or a surgical cut in the perineum (episiotomy), check the area for signs of infection until you are healed. Check for: More redness, swelling, or pain. Pus or a bad smell. You may be given a squirt bottle to use instead of wiping to clean the perineum area after you use the bathroom. Pat the area gently to dry it. To relieve pain caused by an episiotomy, a tear, or swollen veins in the anus (hemorrhoids), take a warm sitz bath 2-4 times a day, or as many as told by your health care provider. You can use a  bathtub or a basin you put over the toilet as a sitz bath. Breast care In the first few days after delivery, your breasts may feel heavy, full, and uncomfortable (breast engorgement). Milk may also leak from your breasts. Ask your health care provider about ways to help relieve the discomfort. If you breastfeed: Wear a bra that supports your breasts and fits well. Use breast pads to absorb milk that leaks. Keep your nipples clean and dry. Apply creams and ointments as told. You may have uterine contractions every time you breastfeed for up to several weeks after delivery. This helps your uterus return to its normal size. If you have any problems with breastfeeding, tell your health care provider or lactation specialist. If you do not breastfeed: Avoid touching your breasts. Do not squeeze out (express) milk. Doing this can make your breasts produce more milk. Wear a bra that supports your breasts and fits well. Use cold packs to help with swelling. Talk to your health care provider about what over-the-counter medicines can help with pain. Intimacy and sexuality Ask your health care provider when you can engage in sexual activity. This may depend upon: Your risk of infection. How fast you are healing. Your comfort and desire to engage in sexual activity. You are able to get pregnant after delivery, even if you have not had your period. Talk with your health care provider about birth control (contraception) or family planning. Medicines Take over-the-counter and prescription medicines only as told by your health care provider. Take an over-the-counter stool softener to help   ease bowel movements as told by your health care provider. If you were prescribed antibiotics, take them as told by your health care provider. Do not stop using the antibiotic even if you start to feel better. Review all previous and current prescriptions to check for the possible transfer into your breast milk. Ask your  health care provider or lactation specialist for help if needed. Activity Gradually return to your normal activities as told by your health care provider. Rest as much as possible. Try to nap while your baby is sleeping. Eating and drinking  Drink enough fluid to keep your urine pale yellow. To help prevent or relieve constipation, eat high-fiber foods every day. Choose healthy eating to support breastfeeding and healing. Take your prenatal vitamins until your health care provider tells you to stop. General recommendations Do not use any products that contain nicotine or tobacco. These products include cigarettes, chewing tobacco, and vaping devices, such as e-cigarettes. These can delay healing. If you need help quitting, ask your health care provider. Secondhand smoke exposure is dangerous for your baby. It can increase the risk of illness and sudden infant death syndrome (SIDS). Nicotine and other chemicals pass through breast milk to the baby. Alcohol use can be dangerous during the postpartum period. Drinking alcohol may impair your judgment and ability to safely care for your baby. Not drinking alcohol is the safest option for breastfeeding mothers. Alcohol passes through breast milk to the baby. Alcohol can be damaging to your baby's development, growth, and sleep patterns. If you breastfeed and drink alcohol, wait at least 2 hours after a single drink before feeding your baby. Do not take medicines or drugs that are not prescribed to you, especially if you breastfeed. Visit your health care provider for a postpartum checkup within the first 3-6 weeks after delivery. Complete a comprehensive postpartum visit no later than 12 weeks after delivery. Keep all follow-up visits. Your health care provider will check your healing after delivery and also check your blood pressure. Where to find more information U.S. Department of Health and Human Services Office of Women's Health:  womenshealth.gov The American College of Obstetricians and Gynecologists: acog.org Contact a health care provider if: You feel unusually sad or worried. Your breasts become red, painful, or hard. You have nausea and vomiting and are unable to eat or drink anything for 24 hours. You have a fever or other signs of infection. You have bleeding that soaks through one pad an hour or you have blood clots the size of an egg or larger. You have a severe headache that does not go away or you have a headache with vision changes. Get help right away if: You have chest pain or difficulty breathing. You have sudden, severe leg pain. You faint or have a seizure. You have thoughts about hurting yourself or your baby. You have any of the following symptoms and you were unable to reach your health care provider: A fever or other signs of infection. Bleeding that is soaking through one pad an hour or you have blood clots the size of an egg or larger. A severe headache that does not go away or you have a headache with vision changes. These symptoms may be an emergency. Get help right away. Call 911. Do not wait to see if the symptoms will go away. Do not drive yourself to the hospital. Get help if you ever feel like you may hurt yourself or others, or have thoughts about taking your own life. Go   to your nearest emergency room or: Call 911. Call the National Suicide Prevention Lifeline at 1-800-273-8255 or 988. This is open 24 hours a day. Text the Crisis Text Line at 741741. This information is not intended to replace advice given to you by your health care provider. Make sure you discuss any questions you have with your health care provider. Document Revised: 02/09/2022 Document Reviewed: 08/02/2021 Elsevier Patient Education  2023 Elsevier Inc.  

## 2022-09-07 ENCOUNTER — Encounter: Payer: Self-pay | Admitting: Obstetrics

## 2022-09-07 ENCOUNTER — Other Ambulatory Visit (HOSPITAL_COMMUNITY)
Admission: RE | Admit: 2022-09-07 | Discharge: 2022-09-07 | Disposition: A | Discharge status: Home / Self Care | Payer: Medicaid Other | Source: Ambulatory Visit | Attending: Licensed Practical Nurse | Admitting: Licensed Practical Nurse

## 2022-09-07 ENCOUNTER — Ambulatory Visit (INDEPENDENT_AMBULATORY_CARE_PROVIDER_SITE_OTHER): Payer: Medicaid Other | Admitting: Obstetrics

## 2022-09-07 VITALS — BP 121/79 | HR 90 | Wt 161.0 lb

## 2022-09-07 DIAGNOSIS — Z124 Encounter for screening for malignant neoplasm of cervix: Secondary | ICD-10-CM

## 2022-09-07 DIAGNOSIS — Z30011 Encounter for initial prescription of contraceptive pills: Secondary | ICD-10-CM

## 2022-09-07 DIAGNOSIS — N898 Other specified noninflammatory disorders of vagina: Secondary | ICD-10-CM

## 2022-09-07 MED ORDER — NORETHIN ACE-ETH ESTRAD-FE 1-20 MG-MCG PO TABS: 1.0000 | ORAL_TABLET | Freq: Every day | ORAL | 3 refills | Status: DC

## 2022-09-11 ENCOUNTER — Other Ambulatory Visit: Payer: Self-pay | Admitting: Obstetrics

## 2022-09-11 ENCOUNTER — Encounter: Payer: Self-pay | Admitting: Obstetrics

## 2022-09-11 DIAGNOSIS — B9689 Other specified bacterial agents as the cause of diseases classified elsewhere: Secondary | ICD-10-CM

## 2022-09-11 LAB — NUSWAB VAGINITIS PLUS (VG+)
Candida albicans, NAA: NEGATIVE
Candida glabrata, NAA: NEGATIVE
Chlamydia trachomatis, NAA: NEGATIVE
Neisseria gonorrhoeae, NAA: NEGATIVE
Trich vag by NAA: NEGATIVE

## 2022-09-11 LAB — CYTOLOGY - PAP: Diagnosis: NEGATIVE

## 2022-09-11 MED ORDER — METRONIDAZOLE 500 MG PO TABS: 500.0000 mg | ORAL_TABLET | Freq: Two times a day (BID) | ORAL | 0 refills | Status: AC

## 2022-09-11 NOTE — Progress Notes (Signed)
Patient had a vaginal discharge at her 6 week PP visit and an aptima swab was sent. + for BV. I have sent in a prescription for Metronidazole and notified the patient via MYChart.  Mirna Mires, CNM  09/11/2022 10:44 PM

## 2022-09-13 ENCOUNTER — Telehealth: Payer: Self-pay

## 2022-09-13 NOTE — Telephone Encounter (Signed)
Pt called triage to talk to MMF. She wants to ask MMF if she can switch flagyl Rx for clindamycin Rx. She says Isabelle Course gave her this Rx and she feels like it works better for her recurrent BV. Clindamycin is not in our protocol to prescribe.

## 2022-09-14 ENCOUNTER — Other Ambulatory Visit: Payer: Self-pay | Admitting: Obstetrics

## 2022-09-14 DIAGNOSIS — N760 Acute vaginitis: Secondary | ICD-10-CM

## 2022-09-14 MED ORDER — CLINDAMYCIN HCL 300 MG PO CAPS: 300.0000 mg | ORAL_CAPSULE | Freq: Two times a day (BID) | ORAL | 0 refills | Status: AC

## 2022-09-15 NOTE — Telephone Encounter (Signed)
Rx sent 

## 2022-09-15 NOTE — Telephone Encounter (Signed)
Called pt, taken straight to voicemail, left msg saying Rx sent to pharmacy.

## 2023-02-09 ENCOUNTER — Ambulatory Visit: Payer: Medicaid Other

## 2023-02-09 NOTE — Progress Notes (Deleted)
    NURSE VISIT NOTE  Subjective:    Patient ID: Samantha Gordon, female    DOB: 2000/06/04, 22 y.o.   MRN: [id number]  HPI  Patient is a 22 y.o. G70P2002 female who presents for {pe vag discharge desc:315065} vaginal discharge for *** {gen duration:315003}. Denies abnormal vaginal bleeding or significant pelvic pain or fever. {Actions; denies/reports/admits to:19208} {UTI Symptoms:210800002}. Patient {has/denies:315300} history of known exposure to STD.   Objective:    There were no vitals taken for this visit.   @THIS  VISIT ONLY@  Assessment:   No diagnosis found.  {vaginitis type:315262}  Plan:   GC and chlamydia DNA  probe sent to lab. Treatment: {vaginitis tx:315263} ROV prn if symptoms persist or worsen.   Fonda Kinder, CMA

## 2023-02-28 ENCOUNTER — Ambulatory Visit: Payer: Medicaid Other | Admitting: Obstetrics and Gynecology

## 2023-03-02 ENCOUNTER — Ambulatory Visit: Payer: Medicaid Other | Admitting: Licensed Practical Nurse

## 2023-03-30 ENCOUNTER — Encounter: Payer: Self-pay | Admitting: Obstetrics

## 2023-03-30 ENCOUNTER — Ambulatory Visit (INDEPENDENT_AMBULATORY_CARE_PROVIDER_SITE_OTHER): Payer: Medicaid Other | Admitting: Obstetrics

## 2023-03-30 ENCOUNTER — Other Ambulatory Visit (HOSPITAL_COMMUNITY)
Admission: RE | Admit: 2023-03-30 | Discharge: 2023-03-30 | Disposition: A | Discharge status: Home / Self Care | Payer: Medicaid Other | Source: Ambulatory Visit | Attending: Obstetrics | Admitting: Obstetrics

## 2023-03-30 VITALS — BP 119/69 | HR 95 | Ht 62.0 in | Wt 178.0 lb

## 2023-03-30 DIAGNOSIS — R300 Dysuria: Secondary | ICD-10-CM | POA: Diagnosis not present

## 2023-03-30 DIAGNOSIS — Z3009 Encounter for other general counseling and advice on contraception: Secondary | ICD-10-CM

## 2023-03-30 DIAGNOSIS — Z113 Encounter for screening for infections with a predominantly sexual mode of transmission: Secondary | ICD-10-CM | POA: Insufficient documentation

## 2023-03-30 DIAGNOSIS — N898 Other specified noninflammatory disorders of vagina: Secondary | ICD-10-CM

## 2023-03-30 DIAGNOSIS — Z3202 Encounter for pregnancy test, result negative: Secondary | ICD-10-CM

## 2023-03-30 DIAGNOSIS — Z30014 Encounter for initial prescription of intrauterine contraceptive device: Secondary | ICD-10-CM | POA: Diagnosis not present

## 2023-03-30 DIAGNOSIS — Z30430 Encounter for insertion of intrauterine contraceptive device: Secondary | ICD-10-CM

## 2023-03-30 LAB — POCT URINALYSIS DIPSTICK
Bilirubin, UA: NEGATIVE
Blood, UA: NEGATIVE
Glucose, UA: NEGATIVE
Ketones, UA: NEGATIVE
Nitrite, UA: NEGATIVE
Protein, UA: POSITIVE — AB
Urobilinogen, UA: 0.2 U/dL
pH, UA: 6 (ref 5.0–8.0)

## 2023-03-30 LAB — POCT URINE PREGNANCY: Preg Test, Ur: NEGATIVE

## 2023-03-30 MED ORDER — PARAGARD INTRAUTERINE COPPER IU IUD
1.0000 | INTRAUTERINE_SYSTEM | Freq: Once | INTRAUTERINE | Status: AC
  Administered 2023-03-30: 1 via INTRAUTERINE

## 2023-03-30 NOTE — Progress Notes (Signed)
Obstetrics & Gynecology Office Visit   Chief Complaint: No chief complaint on file.   History of Present Illness: The patient presents requesting STI screening and to discuss contraception. She is not using the OCPs that had been prescribed for her. A chart review reveals she ahs had ;multiple visits for STI testing. She is not presently contracepting. She has had nausea with hormonal OCPs. Had concerned either Nexplanon or a copper IUD and wants to discuss this today.   Review of Systems:  Review of Systems  Constitutional: Negative.   HENT: Negative.    Eyes: Negative.   Respiratory: Negative.    Cardiovascular: Negative.   Gastrointestinal: Negative.   Genitourinary: Negative.   Musculoskeletal: Negative.   Skin: Negative.   Neurological: Negative.   Endo/Heme/Allergies: Negative.   Psychiatric/Behavioral: Negative.       Past Medical History:  Past Medical History:  Diagnosis Date   Medical history non-contributory    Supervision of other normal pregnancy, antepartum 01/04/2022          Clinical Staff  Provider  Office Location   [facility]  Dating   07/08/2022, by Last Menstrual Period  Language   English  Anatomy US   Normal female  Flu Vaccine      Genetic Screen   NIPS: Negative/Female 01/09/2022  TDaP vaccine    02/16  Hgb A1C or   GTT  Early :  Third trimester : 150  Covid        LAB RESULTS   Rhogam   B/Positive/-- (08/28 1437)   Blood Type  B/Positive/-- (08/28 143    Past Surgical History:  Past Surgical History:  Procedure Laterality Date   NO PAST SURGERIES      Gynecologic History: No LMP recorded.  Obstetric History: [id number]  Family History:  Family History  Problem Relation Age of Onset   Seizures Maternal Grandmother    Diabetes Paternal Grandmother     Social History:  Social History   Socioeconomic History   Marital status: Significant Other    Spouse name: Lafayette Dragon   Number of children: 1   Years of education: 12   Highest education  level: Not on file  Occupational History   Occupation: unemployed  Tobacco Use   Smoking status: Never   Smokeless tobacco: Never  Vaping Use   Vaping status: Never Used  Substance and Sexual Activity   Alcohol use: No   Drug use: No   Sexual activity: Yes    Partners: Male    Birth control/protection: None, Condom    Comment: undecided  Other Topics Concern   Not on file  Social History Narrative   Not on file   Social Determinants of Health   Financial Resource Strain: Low Risk  (01/04/2022)   Overall Financial Resource Strain (CARDIA)    Difficulty of Paying Living Expenses: Not very hard  Food Insecurity: Food Insecurity Present (01/04/2022)   Hunger Vital Sign    Worried About Running Out of Food in the Last Year: Never true    Ran Out of Food in the Last Year: Sometimes true  Transportation Needs: No Transportation Needs (01/04/2022)   PRAPARE - Administrator, Civil Service (Medical): No    Lack of Transportation (Non-Medical): No  Physical Activity: Insufficiently Active (01/04/2022)   Exercise Vital Sign    Days of Exercise per Week: 3 days    Minutes of Exercise per Session: 30 min  Stress: No Stress Concern Present (  01/04/2022)   [employer] of Occupational Health - Occupational Stress Questionnaire    Feeling of Stress : Not at all  Social Connections: Unknown (01/04/2022)   Social Connection and Isolation Panel [NHANES]    Frequency of Communication with Friends and Family: More than three times a week    Frequency of Social Gatherings with Friends and Family: Three times a week    Attends Religious Services: More than 4 times per year    Active Member of Clubs or Organizations: No    Attends Banker Meetings: Never    Marital Status: Not on file  Intimate Partner Violence: Not At Risk (01/04/2022)   Humiliation, Afraid, Rape, and Kick questionnaire    Fear of Current or Ex-Partner: No    Emotionally Abused: No    Physically  Abused: No    Sexually Abused: No    Allergies:  Allergies  Allergen Reactions   Penicillins Rash    Medications: Prior to Admission medications   Medication Sig Start Date End Date Taking? Authorizing Provider  norethindrone-ethinyl estradiol-FE (JUNEL FE 1/20) 1-20 MG-MCG tablet Take 1 tablet by mouth daily. 09/07/22   Mirna Mires, CNM  Prenatal Vit-Fe Fumarate-FA (MULTIVITAMIN-PRENATAL) 27-0.8 MG TABS tablet Take 1 tablet by mouth daily at 12 noon. Patient not taking: Reported on 09/07/2022    [provider]    Physical Exam Vitals: There were no vitals filed for this visit. No LMP recorded.  Physical Exam Constitutional:      Appearance: She is obese.  HENT:     Head: Normocephalic and atraumatic.  Cardiovascular:     Rate and Rhythm: Normal rate and regular rhythm.     Pulses: Normal pulses.     Heart sounds: Normal heart sounds.  Pulmonary:     Effort: Pulmonary effort is normal.     Breath sounds: Normal breath sounds.  Abdominal:     Palpations: Abdomen is soft.  Genitourinary:    General: Normal vulva.     Rectum: Normal.     Comments:  No external lesions or areas of irritation. Bimanual- uterus is anteverted, non enlarged, no adnexal enlargement or tenderness. No malodor, scant vaginal discharge. Aptima swab retrieved. Musculoskeletal:        General: Normal range of motion.     Cervical back: Normal range of motion and neck supple.  Skin:    General: Skin is warm and dry.  Neurological:     General: No focal deficit present.     Mental Status: She is oriented to person, place, and time.  Psychiatric:        Mood and Affect: Mood normal.        Behavior: Behavior normal.    A:  Desires change of birth control from previous use of OCPs to paragard IUD. IUD insertion with paragard   Plan: Problem List Items Addressed This Visit   None We discussed LARC today as she feels that her "nest is complete".  Hesitant to proceed with a Mirena  IUD due to hormone "sensitivity" After revieweing numerous methods, benfits of LARCs as well as risks, she opts for paragard insertion today. She also requests STI screening. Discussed both the aptima swab and blood draw for HIV panel. IUD Insertion Procedure Note Patient identified, informed consent performed, consent signed.   Discussed risks of irregular bleeding, cramping, infection, malpositioning, expulsion or uterine perforation of the IUD (1:1000 placements)  which may require further procedure such as laparoscopy.  IUD while effective  at preventing pregnancy do not prevent transmission of sexually transmitted diseases and use of barrier methods for this purpose was discussed. Time out was performed.  Urine pregnancy test negative.  Speculum placed in the vagina.  Cervix visualized.  Cleaned with Betadine x 2.  Grasped anteriorly with a single tooth tenaculum.  Uterus sounded to 8 cm. IUD placed per manufacturer's recommendations.  Strings trimmed to 3 cm. Tenaculum was removed, good hemostasis noted.  Patient tolerated procedure well.   Patient was given post-procedure instructions.  She was advised to have backup contraception for one week.  Patient was also asked to check IUD strings periodically and follow up in 4 weeks for IUD check.    Mirna Mires, CNM  03/30/2023 11:24 AM

## 2023-03-31 LAB — HEP, RPR, HIV PANEL
HIV Screen 4th Generation wRfx: NONREACTIVE
Hepatitis B Surface Ag: NEGATIVE
RPR Ser Ql: NONREACTIVE

## 2023-04-01 LAB — URINE CULTURE

## 2023-04-03 ENCOUNTER — Telehealth: Payer: Self-pay

## 2023-04-03 DIAGNOSIS — B3731 Acute candidiasis of vulva and vagina: Secondary | ICD-10-CM

## 2023-04-03 LAB — CERVICOVAGINAL ANCILLARY ONLY
Bacterial Vaginitis (gardnerella): NEGATIVE
Candida Glabrata: NEGATIVE
Candida Vaginitis: POSITIVE — AB
Chlamydia: NEGATIVE
Comment: NEGATIVE
Comment: NEGATIVE
Comment: NEGATIVE
Comment: NEGATIVE
Comment: NEGATIVE
Comment: NORMAL
Neisseria Gonorrhea: NEGATIVE
Trichomonas: NEGATIVE

## 2023-04-03 MED ORDER — FLUCONAZOLE 150 MG PO TABS: 150.0000 mg | ORAL_TABLET | Freq: Once | ORAL | 0 refills | Status: AC

## 2023-04-03 NOTE — Telephone Encounter (Signed)
Patient has viewed 03/30/23 lab results and has seen swab results +YI. Inquiring about treatment. Advised can use OTC Monistat 7 day or we can send rx for Diflucan. Patient prefers rx. Rx sent.

## 2023-04-04 ENCOUNTER — Telehealth: Payer: Self-pay

## 2023-04-04 MED ORDER — FLUCONAZOLE 150 MG PO TABS: 150.0000 mg | ORAL_TABLET | Freq: Once | ORAL | 0 refills | Status: AC

## 2023-04-04 NOTE — Telephone Encounter (Signed)
Pt calling; was rx'd diflucan yesterday; she threw up after she took it; can she have another rx?  Adv I would refill.

## 2023-04-27 ENCOUNTER — Ambulatory Visit: Payer: Medicaid Other | Admitting: Obstetrics

## 2023-05-01 NOTE — Progress Notes (Unsigned)
    GYNECOLOGY PROGRESS NOTE  Subjective:    Patient ID: Samantha Gordon, female    DOB: 25-Oct-2000, 22 y.o.   MRN: [id number]  HPI  Patient is a 22 y.o. G45P2002 female who presents for IUD string check.   {Common ambulatory SmartLinks:19316}  Review of Systems {ros; complete:30496}   Objective:   not currently breastfeeding. There is no height or weight on file to calculate BMI. General appearance: {general exam:16600} Abdomen: {abdominal exam:16834} Pelvic: {pelvic exam:16852::"cervix normal in appearance","external genitalia normal","no adnexal masses or tenderness","no cervical motion tenderness","rectovaginal septum normal","uterus normal size, shape, and consistency","vagina normal without discharge"} Extremities: {extremity exam:5109} Neurologic: {neuro exam:17854}   Assessment:   No diagnosis found.   Plan:   There are no diagnoses linked to this encounter.

## 2023-05-02 ENCOUNTER — Ambulatory Visit: Payer: Medicaid Other | Admitting: Obstetrics

## 2023-05-02 ENCOUNTER — Other Ambulatory Visit (HOSPITAL_COMMUNITY)
Admission: RE | Admit: 2023-05-02 | Discharge: 2023-05-02 | Disposition: A | Discharge status: Home / Self Care | Payer: Medicaid Other | Source: Ambulatory Visit | Attending: Obstetrics | Admitting: Obstetrics

## 2023-05-02 VITALS — BP 125/65 | HR 106 | Ht 62.0 in | Wt 176.9 lb

## 2023-05-02 DIAGNOSIS — N898 Other specified noninflammatory disorders of vagina: Secondary | ICD-10-CM

## 2023-05-02 DIAGNOSIS — Z30431 Encounter for routine checking of intrauterine contraceptive device: Secondary | ICD-10-CM | POA: Diagnosis present

## 2023-05-02 DIAGNOSIS — Z975 Presence of (intrauterine) contraceptive device: Secondary | ICD-10-CM | POA: Insufficient documentation

## 2023-05-02 DIAGNOSIS — Z8619 Personal history of other infectious and parasitic diseases: Secondary | ICD-10-CM

## 2023-05-04 LAB — CERVICOVAGINAL ANCILLARY ONLY
Bacterial Vaginitis (gardnerella): NEGATIVE
Candida Glabrata: NEGATIVE
Candida Vaginitis: NEGATIVE
Chlamydia: NEGATIVE
Comment: NEGATIVE
Comment: NEGATIVE
Comment: NEGATIVE
Comment: NEGATIVE
Comment: NEGATIVE
Comment: NORMAL
Neisseria Gonorrhea: NEGATIVE
Trichomonas: NEGATIVE

## 2023-08-08 ENCOUNTER — Ambulatory Visit

## 2023-08-22 ENCOUNTER — Other Ambulatory Visit (HOSPITAL_COMMUNITY)
Admission: RE | Admit: 2023-08-22 | Discharge: 2023-08-22 | Disposition: A | Discharge status: Home / Self Care | Source: Ambulatory Visit

## 2023-08-22 ENCOUNTER — Ambulatory Visit (INDEPENDENT_AMBULATORY_CARE_PROVIDER_SITE_OTHER)

## 2023-08-22 VITALS — BP 120/84 | HR 94 | Wt 175.8 lb

## 2023-08-22 DIAGNOSIS — Z113 Encounter for screening for infections with a predominantly sexual mode of transmission: Secondary | ICD-10-CM | POA: Diagnosis present

## 2023-08-22 DIAGNOSIS — Z30431 Encounter for routine checking of intrauterine contraceptive device: Secondary | ICD-10-CM | POA: Diagnosis not present

## 2023-08-22 NOTE — Progress Notes (Signed)
   GYN ENCOUNTER  Encounter for LLQ pain and IUD check  Subjective  HPI: Samantha Gordon is a 23 y.o. [phone] who presents today with concerns about abdominal pain that occurred 2 weeks ago and lasted for 4 days. She describes the pain as sharp and worse with movement. Pain did respond to heat and Ibuprofen and resolved on its own on day 4. Pt has Paragard IUD in place and is wants to ensure it is still in the correct position and not the cause of her pain. She reports performing regular string checks every few weeks and reports that during the last check her strings did feel longer. Pt also requesting STI screening at this time.   Past Medical History:  Diagnosis Date   Medical history non-contributory    Supervision of other normal pregnancy, antepartum 01/04/2022          Clinical Staff  Provider  Office Location   [facility]  Dating   07/08/2022, by Last Menstrual Period  Language   English  Anatomy US   Normal female  Flu Vaccine      Genetic Screen   NIPS: Negative/Female 01/09/2022  TDaP vaccine    02/16  Hgb A1C or   GTT  Early :  Third trimester : 150  Covid        LAB RESULTS   Rhogam   B/Positive/-- (08/28 1437)   Blood Type  B/Positive/-- (08/28 143   Past Surgical History:  Procedure Laterality Date   NO PAST SURGERIES     OB History     Gravida  2   Para  2   Term  2   Preterm      AB      Living  2      SAB      IAB      Ectopic      Multiple  0   Live Births  2          Allergies  Allergen Reactions   Penicillins Rash    Review of Systems  12 point ROS negative except for pertinent positives noted in HPO above.   Objective  BP 120/84 (BP Location: Right Arm, Patient Position: Sitting, Cuff Size: Normal)   Pulse 94   Wt 79.7 kg   BMI 32.15 kg/m   Physical examination GENERAL APPEARANCE: alert, well appearing, oriented to person, place and time LUNGS: even, non-labored respirations HEART: regular rate and rhythm ABDOMEN:  soft, nontender, nondistended, no abnormal masses, no epigastric pain     NEUROLOGIC: alert, oriented, normal speech, no focal findings or movement          disorder noted     GU: speculum exam performed, IUD strings visualized, no parts of IUD present in         Cervix. No CMT.    Assessment/Plan  LLQ pain - Likely a ruptured ovarian cyst, low suspicion for acute process. -Reassuring that pain is now resolved, discussed this with patient.  -TVUS ordered for IUD placement confirmation.   Health Maintenance -STI screening performed, will reach out with abnormal results.    Adelfa Koh Oyinkansola Truax, Student-MidWife  08/22/23 11:58 AM

## 2023-08-23 LAB — CERVICOVAGINAL ANCILLARY ONLY
Bacterial Vaginitis (gardnerella): POSITIVE — AB
Candida Glabrata: NEGATIVE
Candida Vaginitis: NEGATIVE
Chlamydia: NEGATIVE
Comment: NEGATIVE
Comment: NEGATIVE
Comment: NEGATIVE
Comment: NEGATIVE
Comment: NEGATIVE
Comment: NORMAL
Neisseria Gonorrhea: NEGATIVE
Trichomonas: NEGATIVE

## 2023-08-23 LAB — HEP, RPR, HIV PANEL
HIV Screen 4th Generation wRfx: NONREACTIVE
Hepatitis B Surface Ag: NEGATIVE
RPR Ser Ql: NONREACTIVE

## 2023-08-24 ENCOUNTER — Other Ambulatory Visit: Payer: Self-pay

## 2023-08-24 DIAGNOSIS — N760 Acute vaginitis: Secondary | ICD-10-CM

## 2023-08-24 MED ORDER — METRONIDAZOLE 500 MG PO TABS: 500.0000 mg | ORAL_TABLET | Freq: Two times a day (BID) | ORAL | 0 refills | Status: AC

## 2023-08-27 ENCOUNTER — Ambulatory Visit
Admission: RE | Admit: 2023-08-27 | Discharge: 2023-08-27 | Disposition: A | Discharge status: Home / Self Care | Source: Ambulatory Visit

## 2023-08-27 DIAGNOSIS — Z30431 Encounter for routine checking of intrauterine contraceptive device: Secondary | ICD-10-CM | POA: Diagnosis present

## 2023-09-05 ENCOUNTER — Other Ambulatory Visit: Payer: Self-pay

## 2023-09-05 ENCOUNTER — Telehealth: Payer: Self-pay

## 2023-09-05 DIAGNOSIS — B9689 Other specified bacterial agents as the cause of diseases classified elsewhere: Secondary | ICD-10-CM

## 2023-09-05 MED ORDER — METRONIDAZOLE 500 MG PO TABS
500.0000 mg | ORAL_TABLET | Freq: Two times a day (BID) | ORAL | 0 refills | Status: DC
Start: 2023-09-05 — End: 2023-09-18

## 2023-09-05 NOTE — Telephone Encounter (Signed)
 [facility] called triage stating mom threw her antibiotics away by accident and she still had 3-4 days left. Was approved by Charisse Conception, CNM to send in a new prescription.

## 2023-09-18 ENCOUNTER — Other Ambulatory Visit: Payer: Self-pay

## 2023-09-18 DIAGNOSIS — N760 Acute vaginitis: Secondary | ICD-10-CM

## 2023-09-18 MED ORDER — METRONIDAZOLE 500 MG PO TABS: 500.0000 mg | ORAL_TABLET | Freq: Two times a day (BID) | ORAL | 0 refills | Status: DC

## 2023-11-29 ENCOUNTER — Encounter: Payer: Self-pay | Admitting: Licensed Practical Nurse

## 2023-11-29 ENCOUNTER — Ambulatory Visit: Admitting: Licensed Practical Nurse

## 2023-11-29 VITALS — BP 117/72 | HR 84 | Ht 63.0 in | Wt 172.8 lb

## 2023-11-29 DIAGNOSIS — B9689 Other specified bacterial agents as the cause of diseases classified elsewhere: Secondary | ICD-10-CM

## 2023-11-29 DIAGNOSIS — N760 Acute vaginitis: Secondary | ICD-10-CM | POA: Diagnosis not present

## 2023-11-29 DIAGNOSIS — N898 Other specified noninflammatory disorders of vagina: Secondary | ICD-10-CM

## 2023-11-29 MED ORDER — METRONIDAZOLE 500 MG PO TABS: 500.0000 mg | ORAL_TABLET | Freq: Two times a day (BID) | ORAL | 0 refills | Status: AC

## 2023-11-29 NOTE — Progress Notes (Addendum)
 Janit Alm Agent, MD   No chief complaint on file.   HPI:      Samantha Gordon is a 23 y.o. [phone] whose LMP was Patient's last menstrual period was 11/23/2023 (exact date)., presents today for recurrent BV. States the past 6 months, she will be treated with BV and then 3 months later, the symptoms return. Having fishy odor, watery thin white discharge, itching. Denies urinary symptoms, back pain. States partner was tested a year ago and positive for NGU, was treated with antibiotics. She was then tested and had BV.  Has a paraguard, placed in November 2024. Sexually active with one partner, STI testing 4 months ago was normal.      Patient Active Problem List   Diagnosis Date Noted   IUD (intrauterine device) in place 05/02/2023   Routine screening for STI (sexually transmitted infection) 07/04/2022    Past Surgical History:  Procedure Laterality Date   NO PAST SURGERIES      Family History  Problem Relation Age of Onset   Seizures Maternal Grandmother    Diabetes Paternal Grandmother     Social History   Socioeconomic History   Marital status: Significant Other    Spouse name: Trinna   Number of children: 1   Years of education: 12   Highest education level: Not on file  Occupational History   Occupation: unemployed  Tobacco Use   Smoking status: Never   Smokeless tobacco: Never  Vaping Use   Vaping status: Never Used  Substance and Sexual Activity   Alcohol use: Yes    Comment: soc   Drug use: No   Sexual activity: Yes    Partners: Male    Birth control/protection: None, Condom  Other Topics Concern   Not on file  Social History Narrative   Not on file   Social Drivers of Health   Financial Resource Strain: Low Risk  (01/04/2022)   Overall Financial Resource Strain (CARDIA)    Difficulty of Paying Living Expenses: Not very hard  Food Insecurity: Food Insecurity Present (01/04/2022)   Hunger Vital Sign    Worried About Running Out of  Food in the Last Year: Never true    Ran Out of Food in the Last Year: Sometimes true  Transportation Needs: No Transportation Needs (01/04/2022)   PRAPARE - Administrator, Civil Service (Medical): No    Lack of Transportation (Non-Medical): No  Physical Activity: Insufficiently Active (01/04/2022)   Exercise Vital Sign    Days of Exercise per Week: 3 days    Minutes of Exercise per Session: 30 min  Stress: No Stress Concern Present (01/04/2022)   [employer] of Occupational Health - Occupational Stress Questionnaire    Feeling of Stress : Not at all  Social Connections: Unknown (01/04/2022)   Social Connection and Isolation Panel    Frequency of Communication with Friends and Family: More than three times a week    Frequency of Social Gatherings with Friends and Family: Three times a week    Attends Religious Services: More than 4 times per year    Active Member of Clubs or Organizations: No    Attends Banker Meetings: Never    Marital Status: Not on file  Intimate Partner Violence: Not At Risk (01/04/2022)   Humiliation, Afraid, Rape, and Kick questionnaire    Fear of Current or Ex-Partner: No    Emotionally Abused: No    Physically Abused: No    Sexually  Abused: No    Outpatient Medications Prior to Visit  Medication Sig Dispense Refill   PARAGARD  INTRAUTERINE COPPER  IU 1 each by Intrauterine route once.     metroNIDAZOLE  (FLAGYL ) 500 MG tablet Take 1 tablet (500 mg total) by mouth 2 (two) times daily. (Patient not taking: Reported on 11/29/2023) 14 tablet 0   No facility-administered medications prior to visit.      ROS:  Review of Systems  Constitutional: Negative.   HENT: Negative.    Eyes: Negative.   Respiratory: Negative.    Cardiovascular: Negative.   Gastrointestinal: Negative.   Endocrine: Negative.   Genitourinary:  Positive for vaginal discharge.       Odor, itchy  Musculoskeletal: Negative.   Skin: Negative.    Allergic/Immunologic: Negative.   Neurological: Negative.   Hematological: Negative.   Psychiatric/Behavioral: Negative.       OBJECTIVE:   Vitals:  BP 117/72 (BP Location: Left Arm, Patient Position: Sitting, Cuff Size: Normal)   Pulse 84   Ht 5' 3 (1.6 m)   Wt 78.4 kg   LMP 11/23/2023 (Exact Date)   BMI 30.61 kg/m   Physical Exam Constitutional:      Appearance: Normal appearance.  HENT:     Head: Normocephalic.     Nose: Nose normal.     Mouth/Throat:     Mouth: Mucous membranes are moist.  Eyes:     Pupils: Pupils are equal, round, and reactive to light.  Pulmonary:     Effort: Pulmonary effort is normal.     Breath sounds: Normal breath sounds.  Abdominal:     General: Abdomen is flat.     Palpations: Abdomen is soft.  Genitourinary:    Vagina: Vaginal discharge present.     Comments: Erthyema of vagina, white thin adherent discharge.  Musculoskeletal:        General: Normal range of motion.     Cervical back: Normal range of motion.  Skin:    General: Skin is warm and dry.  Neurological:     Mental Status: She is alert.  Psychiatric:        Mood and Affect: Mood normal.        Behavior: Behavior normal.     Results: No results found for this or any previous visit (from the past 24 hours).   Assessment/Plan: BV (bacterial vaginosis) - Plan: NuSwab VG Plus+Mycoplasmas,NAA, metroNIDAZOLE  (FLAGYL ) 500 MG tablet  Wet prep showed clue cells. Negative KOH test. Plan to start treatment and test for mycoplasma and ureaplasma.   Discussed cotton underwear, boric acid, probiotics, limiting sugary drinks, showing before and/or after sex.   Meds ordered this encounter  Medications   metroNIDAZOLE  (FLAGYL ) 500 MG tablet    Sig: Take 1 tablet (500 mg total) by mouth 2 (two) times daily.    Dispense:  14 tablet    Refill:  0     Ambar Montero-Diaz, Student-MidWife 11/29/2023 4:57 PM

## 2023-12-02 LAB — NUSWAB VG PLUS+MYCOPLASMAS,NAA
Candida albicans, NAA: NEGATIVE
Candida glabrata, NAA: NEGATIVE
Chlamydia trachomatis, NAA: NEGATIVE
Mycoplasma genitalium NAA: NEGATIVE
Mycoplasma hominis NAA: NEGATIVE
Neisseria gonorrhoeae, NAA: NEGATIVE
Trich vag by NAA: NEGATIVE
Ureaplasma spp NAA: POSITIVE — AB

## 2023-12-03 ENCOUNTER — Ambulatory Visit: Payer: Self-pay

## 2023-12-03 DIAGNOSIS — A493 Mycoplasma infection, unspecified site: Secondary | ICD-10-CM

## 2023-12-03 MED ORDER — DOXYCYCLINE HYCLATE 100 MG PO CAPS: 100.0000 mg | ORAL_CAPSULE | Freq: Two times a day (BID) | ORAL | 0 refills | Status: AC

## 2023-12-03 NOTE — Telephone Encounter (Signed)
 Patient calling to discuss 7/17 swab results + Ureaplasma. Advised of treatment with doxycycline  bid x 7 days. Partner needs to be tested/treated. Abstain from intercourse during treatment and 7 days after. Repeat testing needed 3-4 weeks after treatment. Rx sent. Patient inquiring about 6 mos treatment for BV per her converstaion with Jinnie. Myles Jinnie is out of the office today, but will be here tomorrow. Message sent for her review.

## 2023-12-04 ENCOUNTER — Encounter: Payer: Self-pay | Admitting: Licensed Practical Nurse

## 2024-03-19 NOTE — Progress Notes (Unsigned)
    NURSE VISIT NOTE  Subjective:    Patient ID: Samantha Gordon, female    DOB: July 13, 2000, 23 y.o.   MRN: [id number]  HPI  Patient is a 23 y.o. G83P2002 female who presents for {pe vag discharge desc:315065} vaginal discharge for *** {gen duration:315003}. Denies abnormal vaginal bleeding or significant pelvic pain or fever. {Actions; denies/reports/admits to:19208} {UTI Symptoms:210800002}. Patient {has/denies:33800} history of known exposure to STD.   Objective:    There were no vitals taken for this visit.   No results found for any visits on 03/20/24.  Assessment:   1. Vaginal discharge   2. Vaginal odor     {vaginitis type:315262}  Plan:   GC and chlamydia DNA  probe sent to lab. Treatment: {vaginitis tx:315263} ROV prn if symptoms persist or worsen.   Mathis LITTIE Getting, CMA

## 2024-03-20 ENCOUNTER — Ambulatory Visit (INDEPENDENT_AMBULATORY_CARE_PROVIDER_SITE_OTHER)

## 2024-03-20 ENCOUNTER — Other Ambulatory Visit (HOSPITAL_COMMUNITY)
Admission: RE | Admit: 2024-03-20 | Discharge: 2024-03-20 | Disposition: A | Discharge status: Home / Self Care | Source: Ambulatory Visit | Attending: Certified Nurse Midwife | Admitting: Certified Nurse Midwife

## 2024-03-20 VITALS — BP 114/76 | HR 103 | Ht 62.0 in | Wt 169.3 lb

## 2024-03-20 DIAGNOSIS — N898 Other specified noninflammatory disorders of vagina: Secondary | ICD-10-CM

## 2024-03-24 LAB — CERVICOVAGINAL ANCILLARY ONLY
Bacterial Vaginitis (gardnerella): NEGATIVE
Candida Glabrata: NEGATIVE
Candida Vaginitis: NEGATIVE
Chlamydia: NEGATIVE
Comment: NEGATIVE
Comment: NEGATIVE
Comment: NEGATIVE
Comment: NEGATIVE
Comment: NEGATIVE
Comment: NORMAL
Neisseria Gonorrhea: NEGATIVE
Trichomonas: NEGATIVE

## 2024-04-14 NOTE — Progress Notes (Addendum)
 Subjective Patient ID: Samantha Gordon is a 23 y.o. female.    Samantha Gordon is a 23 y.o. female presents to the clinic complaining of headache, sinus pressure, and nasal congestion for the past 3 weeks.  Patient reports that she has been having a constant headache that is associated with dizziness and feeling like she might pass out.  Denies syncopal episodes.  She does not have personal history of migraines but reports her mother does.  Patient was seen 10 days ago at [facility] where she was prescribed cefdinir for bilateral ear infections.  She completed this medication this morning.  Patient states her headaches occur around the same time every day, starting around 10-11AM and progressively worsens during the day.  She has also been having fatigue.  She states that lying down and sleeping as well as taking ibuprofen  does relieve her symptoms.  Patient reports when the headache initially started it was not severe.  Denies thunderclap headache.  She states the pain is 8/10 when the headache does happen.  She is not currently having that level of pain.  She describes the headache as a throbbing pain that worsens with activity and bending over.  She reports that during the day she will also have post nasal drip/drainage she can feel going down her throat.     History provided by:  Patient Language interpreter used: No     Review of Systems  Constitutional:  Negative for chills, fatigue and fever.  HENT:  Positive for congestion, postnasal drip, sinus pressure and sinus pain. Negative for ear discharge, ear pain, rhinorrhea, sore throat, trouble swallowing and voice change.   Respiratory:  Negative for cough, chest tightness and shortness of breath.   Musculoskeletal:  Negative for myalgias.  Neurological:  Positive for headaches.    Patient History  Allergies: Allergies  Allergen Reactions   Penicillins Rash    History reviewed. No pertinent past medical  history. History reviewed. No pertinent surgical history. Social History   Socioeconomic History   Marital status: Not on file    Spouse name: Not on file   Number of children: Not on file   Years of education: Not on file   Highest education level: Not on file  Occupational History   Not on file  Tobacco Use   Smoking status: Never   Smokeless tobacco: Never  Substance and Sexual Activity   Alcohol use: Not on file   Drug use: Not on file   Sexual activity: Not on file  Other Topics Concern   Not on file  Social History Narrative   Not on file   History reviewed. No pertinent family history. No current outpatient medications on file prior to visit.   No current facility-administered medications on file prior to visit.    Objective  Vitals:   04/14/24 1630  BP: 121/82  Pulse: 72  Resp: 19  Temp: 36.6 C (97.9 F)  TempSrc: Tympanic  SpO2: 100%  Weight: 78.5 kg  Height: 5' 3  PainSc:   8  LMP: 04/03/2024        OBGYN/Pregnancy Status: Having periods      No results found.  Physical Exam Vitals and nursing note reviewed.  Constitutional:      Appearance: Normal appearance.  HENT:     Head: Normocephalic.     Right Ear: Ear canal and external ear normal. A middle ear effusion (serous) is present.     Left Ear: Ear canal and external ear normal. A middle  ear effusion (serous) is present.     Nose: No congestion or rhinorrhea.     Right Turbinates: Swollen. Not enlarged.     Left Turbinates: Swollen. Not enlarged.     Right Sinus: Maxillary sinus tenderness present. No frontal sinus tenderness.     Left Sinus: Maxillary sinus tenderness present. No frontal sinus tenderness.     Mouth/Throat:     Lips: Pink.     Mouth: Mucous membranes are moist.     Tongue: No lesions.     Palate: No lesions.     Pharynx: Oropharynx is clear. Uvula midline. No posterior oropharyngeal erythema.     Tonsils: No tonsillar exudate. 0 on the right. 0 on the  left.  Eyes:     Conjunctiva/sclera: Conjunctivae normal.  Neck:     Trachea: Phonation normal.  Cardiovascular:     Rate and Rhythm: Normal rate and regular rhythm.     Heart sounds: Normal heart sounds.  Pulmonary:     Effort: Pulmonary effort is normal.     Breath sounds: Normal breath sounds and air entry.     Comments: Speaking in full sentences, no increase in work of breathing ambulating through the clinic.   Musculoskeletal:     Cervical back: Normal range of motion and neck supple. No rigidity. Normal range of motion.  Lymphadenopathy:     Head:     Right side of head: No submental, submandibular, tonsillar, preauricular, posterior auricular or occipital adenopathy.     Left side of head: No submental, submandibular, tonsillar, preauricular, posterior auricular or occipital adenopathy.     Cervical: No cervical adenopathy.  Skin:    General: Skin is warm and dry.     Capillary Refill: Capillary refill takes less than 2 seconds.  Neurological:     Mental Status: She is alert.  Psychiatric:        Mood and Affect: Mood normal.        Behavior: Behavior normal.     No results found for this visit on 04/14/24.     Procedures MDM:     1 Acute complicated illness or injury     Explanation of Medical Decision Making and variances from expected care:    Patient continues to have sinus symptoms, has been on cefdinir for 10 days for bilateral ear infection.  Ear exam with serous otitis media, no evidence of infectious process.  Patient has maxillary sinus tenderness, ongoing headache and sinus pressure.  Will prescribe doxycycline  for treatment.  Advised patient that her symptoms of headaches may be related to sinus infection, but may also be chronic headache condition that requires further workup.  Patient's headache has features of cluster headaches.  Discussed with patient analgesic rebound headaches.   Advised patient to follow up with PCP.  Patient provided with list of  providers in the area.  Discussed supportive care and OTC medications for symptomatic management. Recheck if worsening or not improving.  Recommended PCP follow up.  Return precautions given. ER precautions given.     Assessment requiring historian other than patient: No     Independent visualization of image, tracing, or test: No     Discussion of management with another provider: No     Risk:: Moderate            Assessment/Plan Diagnoses and all orders for this visit:  Acute bacterial sinusitis -     doxycycline  (Vibramycin ) 100 MG capsule; Take 1 capsule (100 mg total) by mouth  2 (two) times a day for 7 days. Take with at least 8 ounces (large glass) of water, do not lie down for 30 minutes after  Bad headache  Eustachian tube dysfunction, bilateral     Disposition Status: Home  Patient Instructions  Diagnosis: Bacterial Upper Respiratory Infection (URI) This is an infection of the upper airways caused by bacteria. It may involve the sinuses, throat, or bronchi and often causes symptoms like cough, sore throat, nasal congestion, and sometimes fever.  ?? Home Care Instructions:  1. Medications: ??  Antibiotics: Take all prescribed antibiotics exactly as directed, even if you start to feel better before finishing them.  Take the doxycycline  for 5 days.  IF you feel completely better after 5 days of medication, you do not need the entire 7 days.  IF you feel improved but not completely better, take the full 7 day course.    Pain/fever relief: Use acetaminophen  (Tylenol ) or ibuprofen  (Advil , Motrin ) as needed for fever, sore throat, or sinus pain.  Decongestants (if recommended): Use over-the-counter nasal sprays or oral decongestants to ease congestion -- follow label instructions.  2. Hydration and Rest:  Drink plenty of fluids (water, tea, broth) to stay hydrated and help thin mucus.  Get plenty of rest to allow your body to recover.  3. Humidification:  Use a  humidifier or take steamy showers to help loosen mucus and relieve sinus pressure.  4. Avoid Irritants:  Avoid smoking or exposure to secondhand smoke, which can worsen symptoms.  Stay away from strong fumes or allergens while recovering.  ?? When to Seek Medical Attention: Return to the clinic or go to the ER if you experience:  Fever over 101F (38.3C) lasting more than 3 days  Shortness of breath or difficulty breathing  Severe headache or facial pain/swelling  Chest pain  Worsening sore throat, especially with difficulty swallowing  Cough lasting longer than 10-14 days or producing yellow/green mucus despite treatment  ? Follow-Up: Follow up with your primary care provider in 3-5 days if symptoms are not improving or if you have any concerns.  Discharge Instructions - Eustachian Tube Dysfunction (ETD) Diagnosis: Eustachian Tube Dysfunction  What Is It? Eustachian Tube Dysfunction occurs when the small tubes connecting the middle ear to the back of the nose and throat become blocked or do not open properly. This can cause symptoms such as: Ear pressure or fullness Mild hearing changes or muffled hearing Popping or clicking sensations Mild ear pain, especially with pressure changes (e.g., flying, driving through mountains)  This condition is often temporary and commonly caused by allergies, colds, sinus infections, or altitude changes.  ?? Home Care Instructions: 1. Symptom Relief Decongestants (oral or nasal) may help reduce swelling around the Eustachian tube. Use only as directed. Avoid using nasal sprays (like oxymetazoline) for more than 3 consecutive days to prevent rebound congestion. Antihistamines (e.g., cetirizine or loratadine) can help if symptoms are allergy-related. Nasal corticosteroids (e.g., fluticasone) may be recommended to reduce inflammation. Intranasal antihistamine (e.g, Azelastine) may be used as well in conjunction with Flonase.   2.  Pressure Equalization Techniques Try gentle techniques to open the Eustachian tube: Swallowing frequently, chewing gum, or yawning The Valsalva maneuver (gently blowing while pinching your nose and keeping your mouth closed) -- only if safe and comfortable  Use these techniques especially during altitude changes (flying or driving in elevation).  3. Avoid Irritants Avoid smoking or exposure to secondhand smoke. Limit allergens or environmental triggers if allergy-related.  ? What to  Expect Most cases improve within a few days to a couple of weeks.  Symptoms may come and go depending on allergies or upper respiratory infections.  ?? When to Call the Doctor Seek medical attention if you experience: Worsening pain or pressure Persistent symptoms for more than 3-4 weeks Fluid drainage from the ear Fever, significant hearing loss, or dizziness  ??? Follow-Up No routine follow-up is needed if symptoms resolve. If symptoms persist or recur frequently, follow up with your primary care provider or an ENT specialist for further evaluation.    Progress note signed by Gerard Gaskins, PA on 04/14/24 at  6:08 PM

## 2024-05-20 ENCOUNTER — Ambulatory Visit (INDEPENDENT_AMBULATORY_CARE_PROVIDER_SITE_OTHER): Admitting: Certified Nurse Midwife

## 2024-05-20 ENCOUNTER — Encounter: Payer: Self-pay | Admitting: Certified Nurse Midwife

## 2024-05-20 ENCOUNTER — Other Ambulatory Visit (HOSPITAL_COMMUNITY)
Admission: RE | Admit: 2024-05-20 | Discharge: 2024-05-20 | Disposition: A | Discharge status: Home / Self Care | Source: Ambulatory Visit | Attending: Certified Nurse Midwife | Admitting: Certified Nurse Midwife

## 2024-05-20 VITALS — BP 124/86 | HR 93 | Wt 171.5 lb

## 2024-05-20 DIAGNOSIS — R238 Other skin changes: Secondary | ICD-10-CM | POA: Diagnosis not present

## 2024-05-20 DIAGNOSIS — N898 Other specified noninflammatory disorders of vagina: Secondary | ICD-10-CM | POA: Insufficient documentation

## 2024-05-20 MED ORDER — SPIRONOLACTONE 50 MG PO TABS: 50.0000 mg | ORAL_TABLET | Freq: Two times a day (BID) | ORAL | 2 refills | Status: AC

## 2024-05-20 NOTE — Progress Notes (Signed)
" ° ° °  GYNECOLOGY PROGRESS NOTE  Subjective:    Patient ID: Samantha Gordon, female    DOB: 10/15/2000, 24 y.o.   MRN: [id number]  HPI  Patient is a 24 y.o. G73P2002 female who presents for evaluation of unexplained hair growth on her chin, abdomen and thicker hair on her legs.  She also reports hyperpigmentation under her arms.  She continues to have regular monthly periods with the Paragard  IUD (inserted 03/2024).  She also reports one day of vaginal itching.  The following portions of the patient's history were reviewed and updated as appropriate: allergies, current medications, past family history, past medical history, past social history, past surgical history, and problem list.  Review of Systems Pertinent items are noted in HPI.   Objective:   Blood pressure 124/86, pulse 93, weight 171 lb 8 oz (77.8 kg), last menstrual period 04/30/2024, not currently breastfeeding. Body mass index is 31.37 kg/m. General appearance: alert Abdomen: soft, non-tender; bowel sounds normal; no masses,  no organomegaly   Assessment:   1. Vaginal irritation   2. Increased hair growth      Plan:   Reviewed with patient PCOS pathology and diagnostic criteria including hyperandrogenism, ovulatory dysfunction and polycystic ovarian morphology. Clinical hyperandrogenism typically manifests as hirsutism, acne and  rarely alopecia. Biochemical hyperandrogenism is typically measured with total and free testosterone. Ovulatory dysfunction is typically are cycles shorter than 21 days or longer than 35 days or fewer than 8 cycles per year. Polycystic morphology can be identified on ultrasound showing >20 follicles per ovary. Anti-mullerian hormone can be obtain as an alternative to imaging. Discussed that PCOS is managed with symptom treatment personalized to the individual patient.   -Normal US  with no evidence of PCOS on US  6 months prior -Regular cycles -Spontaneous conception of children -Very low  suspicion of PCOS. Will order lab work that may influence hair growth and can mimic PCOS. Will trial spironolactone  but not likely to improve unwanted hair growth.    Has had greater than 3 diagnosed episodes of BV after treatment in the last year. Will obtain swab today and begin long term treatment if positive.   Damien Parsley, CNM [facility]

## 2024-05-22 LAB — CERVICOVAGINAL ANCILLARY ONLY
Bacterial Vaginitis (gardnerella): NEGATIVE
Candida Glabrata: NEGATIVE
Candida Vaginitis: NEGATIVE
Chlamydia: NEGATIVE
Comment: NEGATIVE
Comment: NEGATIVE
Comment: NEGATIVE
Comment: NEGATIVE
Comment: NEGATIVE
Comment: NORMAL
Neisseria Gonorrhea: NEGATIVE
Trichomonas: NEGATIVE

## 2024-05-28 ENCOUNTER — Telehealth: Payer: Self-pay

## 2024-05-28 LAB — TESTOSTERONE,FREE AND TOTAL
Testosterone, Free: 2.3 pg/mL (ref 0.0–4.2)
Testosterone: 32 ng/dL (ref 13–71)

## 2024-05-28 LAB — FSH/LH
FSH: 2 m[IU]/mL
LH: 4.9 m[IU]/mL

## 2024-05-28 LAB — TSH: TSH: 0.8 u[IU]/mL (ref 0.450–4.500)

## 2024-05-28 LAB — PROLACTIN: Prolactin: 20.2 ng/mL (ref 4.8–33.4)

## 2024-05-28 LAB — ANTI MULLERIAN HORMONE: ANTI-MULLERIAN HORMONE (AMH): 1.25 ng/mL

## 2024-05-28 NOTE — Telephone Encounter (Signed)
 Patient calling triage asking if message can be sent to provider regarding her test results from her visit on 05/20/24. Advised message would be sent.

## 2024-05-29 ENCOUNTER — Ambulatory Visit: Payer: Self-pay | Admitting: Certified Nurse Midwife
